# Patient Record
Sex: Male | Born: 1945 | Race: White | Hispanic: No | Marital: Married | Smoking: Former smoker
Health system: Southern US, Community
[De-identification: ages and names within clinical notes are randomized; demographics above are authoritative.]

## PROBLEM LIST (undated history)

## (undated) DIAGNOSIS — E119 Type 2 diabetes mellitus without complications: Secondary | ICD-10-CM

## (undated) DIAGNOSIS — K219 Gastro-esophageal reflux disease without esophagitis: Secondary | ICD-10-CM

## (undated) DIAGNOSIS — T8859XA Other complications of anesthesia, initial encounter: Secondary | ICD-10-CM

## (undated) DIAGNOSIS — I739 Peripheral vascular disease, unspecified: Secondary | ICD-10-CM

## (undated) DIAGNOSIS — F329 Major depressive disorder, single episode, unspecified: Secondary | ICD-10-CM

## (undated) DIAGNOSIS — E781 Pure hyperglyceridemia: Secondary | ICD-10-CM

## (undated) DIAGNOSIS — G4733 Obstructive sleep apnea (adult) (pediatric): Secondary | ICD-10-CM

## (undated) DIAGNOSIS — I10 Essential (primary) hypertension: Secondary | ICD-10-CM

## (undated) DIAGNOSIS — M8580 Other specified disorders of bone density and structure, unspecified site: Secondary | ICD-10-CM

## (undated) DIAGNOSIS — C439 Malignant melanoma of skin, unspecified: Secondary | ICD-10-CM

## (undated) DIAGNOSIS — M549 Dorsalgia, unspecified: Secondary | ICD-10-CM

## (undated) DIAGNOSIS — T4145XA Adverse effect of unspecified anesthetic, initial encounter: Secondary | ICD-10-CM

## (undated) DIAGNOSIS — K279 Peptic ulcer, site unspecified, unspecified as acute or chronic, without hemorrhage or perforation: Secondary | ICD-10-CM

## (undated) DIAGNOSIS — G8929 Other chronic pain: Secondary | ICD-10-CM

## (undated) DIAGNOSIS — F5102 Adjustment insomnia: Secondary | ICD-10-CM

## (undated) DIAGNOSIS — M4800 Spinal stenosis, site unspecified: Secondary | ICD-10-CM

## (undated) DIAGNOSIS — K5792 Diverticulitis of intestine, part unspecified, without perforation or abscess without bleeding: Secondary | ICD-10-CM

## (undated) HISTORY — PX: ELBOW SURGERY: SHX618

## (undated) HISTORY — PX: OTHER SURGICAL HISTORY: SHX169

## (undated) HISTORY — PX: APPENDECTOMY: SHX54

## (undated) HISTORY — PX: SHOULDER SURGERY: SHX246

## (undated) HISTORY — PX: CARPAL TUNNEL RELEASE: SHX101

## (undated) HISTORY — PX: HERNIA REPAIR: SHX51

## (undated) HISTORY — PX: CERVICAL FUSION: SHX112

---

## 2000-03-26 ENCOUNTER — Ambulatory Visit (HOSPITAL_BASED_OUTPATIENT_CLINIC_OR_DEPARTMENT_OTHER): Admission: RE | Admit: 2000-03-26 | Discharge: 2000-03-26 | Payer: Self-pay | Admitting: Orthopedic Surgery

## 2000-06-18 ENCOUNTER — Encounter: Payer: Self-pay | Admitting: Emergency Medicine

## 2000-06-19 ENCOUNTER — Inpatient Hospital Stay (HOSPITAL_COMMUNITY): Admission: EM | Admit: 2000-06-19 | Discharge: 2000-06-20 | Payer: Self-pay | Admitting: Emergency Medicine

## 2002-03-19 ENCOUNTER — Encounter: Admission: RE | Admit: 2002-03-19 | Discharge: 2002-03-19 | Payer: Self-pay | Admitting: *Deleted

## 2003-01-11 ENCOUNTER — Ambulatory Visit (HOSPITAL_COMMUNITY): Admission: RE | Admit: 2003-01-11 | Discharge: 2003-01-11 | Payer: Self-pay | Admitting: *Deleted

## 2003-01-11 ENCOUNTER — Encounter (INDEPENDENT_AMBULATORY_CARE_PROVIDER_SITE_OTHER): Payer: Self-pay | Admitting: Specialist

## 2004-02-15 ENCOUNTER — Encounter: Admission: RE | Admit: 2004-02-15 | Discharge: 2004-05-15 | Payer: Self-pay | Admitting: Psychology

## 2005-05-29 ENCOUNTER — Emergency Department (HOSPITAL_COMMUNITY): Admission: EM | Admit: 2005-05-29 | Discharge: 2005-05-30 | Payer: Self-pay | Admitting: Emergency Medicine

## 2008-02-06 ENCOUNTER — Observation Stay (HOSPITAL_COMMUNITY): Admission: EM | Admit: 2008-02-06 | Discharge: 2008-02-07 | Payer: Self-pay | Admitting: Emergency Medicine

## 2009-08-12 ENCOUNTER — Encounter: Admission: RE | Admit: 2009-08-12 | Discharge: 2009-08-12 | Payer: Self-pay | Admitting: Orthopaedic Surgery

## 2010-06-08 ENCOUNTER — Ambulatory Visit: Payer: Self-pay | Admitting: Diagnostic Radiology

## 2010-06-08 ENCOUNTER — Emergency Department (HOSPITAL_BASED_OUTPATIENT_CLINIC_OR_DEPARTMENT_OTHER): Admission: EM | Admit: 2010-06-08 | Discharge: 2010-06-08 | Payer: Self-pay | Admitting: Emergency Medicine

## 2010-10-08 ENCOUNTER — Encounter: Payer: Self-pay | Admitting: Family Medicine

## 2010-11-30 LAB — BASIC METABOLIC PANEL
Calcium: 9.6 mg/dL (ref 8.4–10.5)
Chloride: 106 mEq/L (ref 96–112)
Creatinine, Ser: 1.2 mg/dL (ref 0.4–1.5)
GFR calc Af Amer: >60 mL/min (ref >60)
Sodium: 142 mEq/L (ref 135–145)

## 2010-11-30 LAB — CBC
Platelets: 216 10*3/uL (ref 150–400)
RBC: 3.96 MIL/uL — ABNORMAL LOW (ref 4.22–5.81)
WBC: 5.8 10*3/uL (ref 4.0–10.5)

## 2010-11-30 LAB — DIFFERENTIAL
Lymphocytes Relative: 29 % (ref 12–46)
Lymphs Abs: 1.7 10*3/uL (ref 0.7–4.0)
Neutrophils Relative %: 58 % (ref 43–77)

## 2011-01-30 NOTE — H&P (Signed)
NAME:  [name], [name] NO.:  [id number]   MEDICAL RECORD NO.:  [id number]          PATIENT TYPE:  INP   LOCATION:  6529                         FACILITY:  [facility]   PHYSICIAN:  Michiel Cowboy, MDDATE OF BIRTH:  04-25-1946   DATE OF ADMISSION:  02/05/2008  DATE OF DISCHARGE:                              HISTORY & PHYSICAL   PRIMARY CARE [place]:  Dr. Corliss Blacker.   CHIEF COMPLAINT:  Chest pain.   The patient is a 65 year old gentleman, history of hypertension and  atypical chest pain status post heart catheterization in 2001 which was  negative, presents with a similar episode of chest pain that started mid  afternoon, was throbbing-like in sensation, was in the left chest, was  on and off throughout the day, kept on coming back, and now worse in  severity than before which prompted the patient to come into the  emergency department.  He also noticed to have abdominal swelling, which  is new for him.  Other than that, no fevers, no chills, no shortness of  breath, maybe a little bit of lightheadedness.  Reports having a bowel  movement this morning.  Otherwise, review of systems is unremarkable.   PAST MEDICAL HISTORY:  Significant for:  1. Hypertension.  2. Chronic back pain.  3. Spinal stenosis.  4. History of irritable bowel syndrome.  5. Peptic ulcer disease.   SOCIAL HISTORY:  The patient lives with wife.  Denies alcohol or drug  abuse.  Quit smoking as a teenager.   FAMILY HISTORY:  Noncontributory.   ALLERGIES:  The patient is allergic to [facility].   MEDICATIONS:  The patient is unsure about his doses.  1. Takes benazepril two tablets once a day.  2. Hydrocodone/acetaminophen three times a day as needed.  3. Metoprolol twice a day.  4. Cymbalta - he thinks his does is 90 mg p.o. daily.  5. AcipHex 40 mg p.o. daily.  6. Baby aspirin.   PHYSICAL EXAMINATION:  VITAL SIGNS:  Temperature 98.8, blood pressure  147/89, pulse 73, respirations 17,  saturating 96% on room air.  The  patient appears to be in no acute distress, lying down on the stretcher.  HEAD:  Nontraumatic, somewhat dry mucous membranes.  LUNGS:  Clear to auscultation bilaterally.  HEART:  Regular rate and rhythm.  No murmurs, rubs or gallops.  LOWER EXTREMITIES:  Without edema.  ABDOMEN:  Distended with diminished bowel sounds.  NEUROLOGIC:  Intact.   LABORATORY:  White blood cell count 6.6, hemoglobin 13.9.  Sodium 141,  potassium 4.1, creatinine 1.2.  Cardiac enzymes negative.  Alk phos 51,  AST 73, ALT 69, total protein 6.9, albumin 3.9.  Myoglobin slightly  elevated at 222.  Chest x-ray showing possible left lower lobe  infiltrate versus atelectasis.  Abdominal film with moderate amount of  stool throughout the colon and large abdominal calcifications,  nonobstructive gas pattern.   EKG showing sinus rhythm; Q waves in lead II, III; small Q waves in V4,  V5, V6; unsure of the significance; no evidence of ischemia or  infarction.   ASSESSMENT AND  PLAN:  1. This is a 65 year old gentleman with atypical chest pain, but given      history of hypertension will admit for assessment of acute coronary      syndrome.  Will obtain cardiac enzymes x3, serial ECG, fasting      lipid panel in a.m., hemoglobin A1c, TSH.  Also will obtain D-      dimer, although the patient is at low risk for PE.  The left lower      extremity atelectasis/infiltrate, the patient is afebrile, has      normal white blood cell count.  Will repeat chest x-ray in a.m.      with better inspiration to see if this resolves.  If it does not,      would need to start on antibiotics.  For now, will hold off.  2. Elevated liver function tests, etiology not clear.  The patient      denies drinking.  Will obtain hepatitis serologies and right upper      quadrant ultrasound to evaluate liver.  3. Abdominal distention.  Right upper quadrant ultrasound will help [place]      to determine if there is any  ascites.  Likely constipation.  Will      give MiraLax, simethicone, Colace.  4. Hypertension.  The patient is unsure of his medications.  Will give      metoprolol 12.5 b.i.d., will need to readjust this in the morning,      and  benazepril 20.  5. Prophylaxis.  Protonix - the patient is already on AcipHex at home,      and Lovenox.  6. Mild dehydration.  Give IV fluids overnight.      Michiel Cowboy, MD  Electronically Signed     AVD/MEDQ  D:  02/06/2008  T:  02/06/2008  Job:  [id number]   cc:   Pam Drown, M.D.

## 2011-02-02 NOTE — Discharge Summary (Signed)
[place]  Patient:    [name], [name]                       MRN: [id number] Adm. Date:  [id number] Disc. Date: [id number] Attending:  Lewayne Bunting Dictator:   Delton See, P.A. CC:         Dr. Quintella Reichert   Discharge Summary  HISTORY OF PRESENT ILLNESS:  [name] is a 65 year old male with a history of hypertension, irritable bowel syndrome, as well as a history of duodenal ulcer, who presented to the emergency room for evaluation of chest pain. The patient related that he had had a stressful day at work and began having chest pain and nausea around 1 p.m. He described the chest pain as a constant throbbing in the left side of his chest, described as a pressure, associated with left hand tingling. The patient was admitted for further evaluation.  PAST MEDICAL HISTORY: 1. Hypertension. 2. History of irritable bowel syndrome. 3. History of duodenal ulcer in 1981.  PAST MEDICAL HISTORY: 1. Status post right hand surgery. 2. Status post appendectomy. 3. Status post bilateral inguinal hernia repair. 4. Status post carpal tunnel release surgery.  SOCIAL HISTORY:  The patient is married and lives with his wife. He has daughters, as well as a son-in-law. He works for a [employer] and has a lot of stress with his job. He has smoked one pack per day for five years. He quit 30 years ago. He drinks minimally. Does not use illegal drugs.  ALLERGIES:  A medication used for spastic colon, he is not sure what the medication was, but he had swelling as a result.  MEDICATIONS AT THE TIME OF ADMISSION: 1. Lotensin 20 mg daily. 2. Aspirin 81 mg daily. 3. Multivitamin daily. 4. Vitamin E daily. 5. Stress tabs p.r.n. 6. Prevacid p.r.n.  HOSPITAL COURSE:  As noted, this patient was admitted to [facility] through the emergency room by Dr. Sharrell Ku for further evaluation of chest pain. He ruled out for an MI.  The patient underwent  cardiac catheterization on June 20, 2000, performed by Dr. Antoine Poche. The patient was found to have normal coronary arteries with an ejection fraction of 65%. Arrangements were made to discharge the patient on June 20, 2000 in an improved condition. His blood pressure was mildly high on the day of discharge. He had been switched to Lopressor initially, after admission, and his Lotensin had been held. On the day of discharge, the Lopressor was discontinued and the Lotensin was resumed. Arrangements were made to discharge the patient later that day in an improved condition.  LABORATORY DATA:  CK-MB on the day of discharge was within normal limits. Basic metabolic panel, on the day of discharge, was within normal limits with a BUN of 15, creatinine 0.9, potassium 4.1. A CBC, on the day of discharge, was normal with hemoglobin 13.9, hematocrit 38.9, WBCs 8.9, platelets 196,000. Cardiac enzymes were negative.  An EKG showed normal sinus rhythm, rate 78 beats per minute with nonspecific T wave abnormalities.  DISCHARGE MEDICATIONS: 1. Lotensin 20 mg daily. 2. Aspirin 81 mg daily. 3. Prevacid as previously taken.  DISCHARGE INSTRUCTIONS:  The patient was told to avoid any strenuous activity or driving for two days. He is to be on a low salt, low fat diet. He was told to call the office if he had any increased pain, swelling or bleeding from his groin. He was to see  Dr. Quintella Reichert in approximately two weeks. He was to followup with Dr. Ladona Ridgel, or his physician assistant, Thursday, October 11, at 8:45 a.m. for a groin check, as well as further evaluation of hypertension.  PROBLEMS AT THE TIME OF DISCHARGE: 1. Chest pain with cardiac catheterization June 20, 2000 showing normal    coronary arteries, ejection fraction greater than 60%. 2. Hypertension. 3. History of duodenal ulcer. 4. History of multiple surgeries. 5. History of irritable bowel syndrome. DD:  06/20/00 TD:  06/20/00 Job:  [id number]

## 2011-03-15 ENCOUNTER — Other Ambulatory Visit (HOSPITAL_COMMUNITY): Payer: Self-pay | Admitting: Anesthesiology

## 2011-03-15 ENCOUNTER — Ambulatory Visit (HOSPITAL_COMMUNITY)
Admission: RE | Admit: 2011-03-15 | Discharge: 2011-03-15 | Disposition: A | Discharge status: Home / Self Care | Payer: Medicare Other | Source: Ambulatory Visit | Attending: Anesthesiology | Admitting: Anesthesiology

## 2011-03-15 DIAGNOSIS — M40299 Other kyphosis, site unspecified: Secondary | ICD-10-CM | POA: Insufficient documentation

## 2011-03-15 DIAGNOSIS — G8929 Other chronic pain: Secondary | ICD-10-CM

## 2011-03-15 DIAGNOSIS — M542 Cervicalgia: Secondary | ICD-10-CM | POA: Insufficient documentation

## 2011-03-15 DIAGNOSIS — Z981 Arthrodesis status: Secondary | ICD-10-CM | POA: Insufficient documentation

## 2011-06-13 LAB — HEPATIC FUNCTION PANEL
Alkaline Phosphatase: 51
Total Protein: 6.9

## 2011-06-13 LAB — COMPREHENSIVE METABOLIC PANEL
Albumin: 3.6
BUN: 15
BUN: 17
Calcium: 9.1
Calcium: 9.4
Creatinine, Ser: 1.13
Creatinine, Ser: 1.24
Glucose, Bld: 120 — ABNORMAL HIGH
Potassium: 4.5
Sodium: 143
Total Protein: 6.3
Total Protein: 6.4

## 2011-06-13 LAB — LIPID PANEL
HDL: 31 — ABNORMAL LOW
LDL Cholesterol: 90
Triglycerides: 355 — ABNORMAL HIGH

## 2011-06-13 LAB — CBC
HCT: 39.9
Hemoglobin: 13.9
Hemoglobin: 13.9
MCHC: 34
MCV: 93.7
RBC: 4.26
RDW: 13.2
WBC: 6.8

## 2011-06-13 LAB — HEMOGLOBIN A1C: Hgb A1c MFr Bld: 7.6 — ABNORMAL HIGH

## 2011-06-13 LAB — POCT CARDIAC MARKERS
Myoglobin, poc: 222
Myoglobin, poc: 226
Operator id: 265201
Troponin i, poc: <0.05

## 2011-06-13 LAB — LIPASE, BLOOD: Lipase: 45

## 2011-06-13 LAB — POCT I-STAT, CHEM 8
BUN: 20
Calcium, Ion: 1.16
Creatinine, Ser: 1.2
Glucose, Bld: 99
TCO2: 28

## 2011-06-13 LAB — CARDIAC PANEL(CRET KIN+CKTOT+MB+TROPI)
CK, MB: 5.7 — ABNORMAL HIGH
CK, MB: 7.8 — ABNORMAL HIGH
Relative Index: 3.2 — ABNORMAL HIGH
Relative Index: 3.5 — ABNORMAL HIGH
Total CK: 180
Troponin I: 0.01

## 2011-06-13 LAB — HEPATITIS B SURFACE ANTIGEN: Hepatitis B Surface Ag: NEGATIVE

## 2011-06-13 LAB — HEPATITIS C ANTIBODY: HCV Ab: NEGATIVE

## 2011-06-13 LAB — D-DIMER, QUANTITATIVE: D-Dimer, Quant: 0.23

## 2011-06-13 LAB — TSH: TSH: 4.709

## 2011-06-13 LAB — B-NATRIURETIC PEPTIDE (CONVERTED LAB): Pro B Natriuretic peptide (BNP): <30

## 2011-06-13 LAB — HEPATITIS B SURFACE ANTIBODY,QUALITATIVE: Hep B S Ab: NEGATIVE

## 2012-08-07 ENCOUNTER — Emergency Department (HOSPITAL_COMMUNITY)
Admission: EM | Admit: 2012-08-07 | Discharge: 2012-08-07 | Disposition: A | Discharge status: Home / Self Care | Payer: Medicare Other | Attending: Emergency Medicine | Admitting: Emergency Medicine

## 2012-08-07 ENCOUNTER — Emergency Department (HOSPITAL_COMMUNITY): Payer: Medicare Other

## 2012-08-07 ENCOUNTER — Encounter (HOSPITAL_COMMUNITY): Payer: Self-pay | Admitting: *Deleted

## 2012-08-07 DIAGNOSIS — Z8659 Personal history of other mental and behavioral disorders: Secondary | ICD-10-CM | POA: Insufficient documentation

## 2012-08-07 DIAGNOSIS — R197 Diarrhea, unspecified: Secondary | ICD-10-CM | POA: Insufficient documentation

## 2012-08-07 DIAGNOSIS — I10 Essential (primary) hypertension: Secondary | ICD-10-CM | POA: Insufficient documentation

## 2012-08-07 DIAGNOSIS — R42 Dizziness and giddiness: Secondary | ICD-10-CM | POA: Insufficient documentation

## 2012-08-07 DIAGNOSIS — Z8711 Personal history of peptic ulcer disease: Secondary | ICD-10-CM | POA: Insufficient documentation

## 2012-08-07 DIAGNOSIS — Z8739 Personal history of other diseases of the musculoskeletal system and connective tissue: Secondary | ICD-10-CM | POA: Insufficient documentation

## 2012-08-07 DIAGNOSIS — R55 Syncope and collapse: Secondary | ICD-10-CM | POA: Insufficient documentation

## 2012-08-07 DIAGNOSIS — Z8669 Personal history of other diseases of the nervous system and sense organs: Secondary | ICD-10-CM | POA: Insufficient documentation

## 2012-08-07 DIAGNOSIS — Z8719 Personal history of other diseases of the digestive system: Secondary | ICD-10-CM | POA: Insufficient documentation

## 2012-08-07 DIAGNOSIS — R112 Nausea with vomiting, unspecified: Secondary | ICD-10-CM | POA: Insufficient documentation

## 2012-08-07 DIAGNOSIS — R079 Chest pain, unspecified: Secondary | ICD-10-CM

## 2012-08-07 DIAGNOSIS — R0789 Other chest pain: Secondary | ICD-10-CM | POA: Insufficient documentation

## 2012-08-07 DIAGNOSIS — Z8589 Personal history of malignant neoplasm of other organs and systems: Secondary | ICD-10-CM | POA: Insufficient documentation

## 2012-08-07 DIAGNOSIS — E785 Hyperlipidemia, unspecified: Secondary | ICD-10-CM | POA: Insufficient documentation

## 2012-08-07 DIAGNOSIS — R109 Unspecified abdominal pain: Secondary | ICD-10-CM

## 2012-08-07 DIAGNOSIS — K5900 Constipation, unspecified: Secondary | ICD-10-CM | POA: Insufficient documentation

## 2012-08-07 DIAGNOSIS — Z8679 Personal history of other diseases of the circulatory system: Secondary | ICD-10-CM | POA: Insufficient documentation

## 2012-08-07 HISTORY — DX: Major depressive disorder, single episode, unspecified: F32.9

## 2012-08-07 HISTORY — DX: Spinal stenosis, site unspecified: M48.00

## 2012-08-07 HISTORY — DX: Gastro-esophageal reflux disease without esophagitis: K21.9

## 2012-08-07 HISTORY — DX: Peptic ulcer, site unspecified, unspecified as acute or chronic, without hemorrhage or perforation: K27.9

## 2012-08-07 HISTORY — DX: Essential (primary) hypertension: I10

## 2012-08-07 HISTORY — DX: Diverticulitis of intestine, part unspecified, without perforation or abscess without bleeding: K57.92

## 2012-08-07 HISTORY — DX: Obstructive sleep apnea (adult) (pediatric): G47.33

## 2012-08-07 HISTORY — DX: Malignant melanoma of skin, unspecified: C43.9

## 2012-08-07 HISTORY — DX: Adjustment insomnia: F51.02

## 2012-08-07 HISTORY — DX: Other specified disorders of bone density and structure, unspecified site: M85.80

## 2012-08-07 HISTORY — DX: Peripheral vascular disease, unspecified: I73.9

## 2012-08-07 HISTORY — DX: Pure hyperglyceridemia: E78.1

## 2012-08-07 LAB — CBC WITH DIFFERENTIAL/PLATELET
Eosinophils Absolute: 0.2 10*3/uL (ref 0.0–0.7)
Eosinophils Relative: 3 % (ref 0–5)
Hemoglobin: 13.9 g/dL (ref 13.0–17.0)
Lymphocytes Relative: 22 % (ref 12–46)
Lymphs Abs: 1.5 10*3/uL (ref 0.7–4.0)
MCH: 31.2 pg (ref 26.0–34.0)
MCV: 90.3 fL (ref 78.0–100.0)
Monocytes Relative: 9 % (ref 3–12)
RBC: 4.45 MIL/uL (ref 4.22–5.81)
WBC: 6.7 10*3/uL (ref 4.0–10.5)

## 2012-08-07 LAB — LIPASE, BLOOD: Lipase: 49 U/L (ref 11–59)

## 2012-08-07 LAB — URINALYSIS, ROUTINE W REFLEX MICROSCOPIC
Bilirubin Urine: NEGATIVE
Hgb urine dipstick: NEGATIVE
Nitrite: NEGATIVE
Protein, ur: NEGATIVE mg/dL
Specific Gravity, Urine: 1.022 (ref 1.005–1.030)
Urobilinogen, UA: 0.2 mg/dL (ref 0.0–1.0)

## 2012-08-07 LAB — COMPREHENSIVE METABOLIC PANEL
ALT: 43 U/L (ref 0–53)
Alkaline Phosphatase: 42 U/L (ref 39–117)
BUN: 20 mg/dL (ref 6–23)
CO2: 27 mEq/L (ref 19–32)
Calcium: 10.3 mg/dL (ref 8.4–10.5)
GFR calc Af Amer: 64 mL/min — ABNORMAL LOW (ref >90)
GFR calc non Af Amer: 55 mL/min — ABNORMAL LOW (ref >90)
Glucose, Bld: 96 mg/dL (ref 70–99)
Potassium: 4.4 mEq/L (ref 3.5–5.1)
Total Protein: 7.1 g/dL (ref 6.0–8.3)

## 2012-08-07 LAB — URINE MICROSCOPIC-ADD ON

## 2012-08-07 LAB — OCCULT BLOOD, POC DEVICE: Fecal Occult Bld: NEGATIVE

## 2012-08-07 MED ORDER — SODIUM CHLORIDE 0.9 % IV BOLUS (SEPSIS): 500.0000 mL | Freq: Once | INTRAVENOUS | Status: DC

## 2012-08-07 MED ORDER — MECLIZINE HCL 50 MG PO TABS: 25.0000 mg | ORAL_TABLET | Freq: Three times a day (TID) | ORAL | Status: DC | PRN

## 2012-08-07 MED ORDER — ONDANSETRON HCL 4 MG/2ML IJ SOLN
4.0000 mg | Freq: Once | INTRAMUSCULAR | Status: AC
  Administered 2012-08-07: 4 mg via INTRAVENOUS
  Filled 2012-08-07: qty 2

## 2012-08-07 MED ORDER — MECLIZINE HCL 25 MG PO TABS
50.0000 mg | ORAL_TABLET | Freq: Once | ORAL | Status: AC
  Administered 2012-08-07: 50 mg via ORAL
  Filled 2012-08-07: qty 2

## 2012-08-07 MED ORDER — IOHEXOL 300 MG/ML  SOLN
100.0000 mL | Freq: Once | INTRAMUSCULAR | Status: AC | PRN
  Administered 2012-08-07: 100 mL via INTRAVENOUS

## 2012-08-07 MED ORDER — SODIUM CHLORIDE 0.9 % IV SOLN
Freq: Once | INTRAVENOUS | Status: AC
  Administered 2012-08-07: 12:00:00 via INTRAVENOUS

## 2012-08-07 NOTE — ED Provider Notes (Signed)
History     CSN: [id number]  Arrival date & time 08/07/12  1017   First MD Initiated Contact with Patient 08/07/12 1054      Chief Complaint  Patient presents with  . Abdominal Pain  . Bloated  . Nausea  . Emesis    (Consider location/radiation/quality/duration/timing/severity/associated sxs/prior treatment) Patient is a 66 y.o. male presenting with abdominal pain and chest pain. The history is provided by the patient and the spouse.  Abdominal Pain The primary symptoms of the illness include abdominal pain, nausea, vomiting and diarrhea. The primary symptoms of the illness do not include fever, shortness of breath, hematemesis or hematochezia.  Associated symptoms comments: Abdominal swelling and discomfort for 4-5 days. Today he started having nausea and vomiting. No fever, hematemesis, or bloody bowel movements. The only abdominal surgery in the past was inguinal hernia repair. Normal colonoscopies in the past.  .   Chest Pain The chest pain began 2 days ago. Chest pain occurs intermittently. Primary symptoms include abdominal pain, nausea, vomiting and dizziness. Pertinent negatives for primary symptoms include no fever and no shortness of breath.  Dizziness also occurs with nausea and vomiting. Dizziness does not occur with weakness.   Pertinent negatives for associated symptoms include no weakness. Associated symptoms comments: He has been having a very mild central lower chest discomfort that is not associated with SOB, cough, fever. No heart disease in the past. He reports a catheterization many years ago that did not require stenting. He also reports dizziness that started several days ago that is worse in certain positions and with movement. No falls. He has had vertigo in the past and states current symptoms are similar. .      Past Medical History  Diagnosis Date  . Hypertension   . Hypertriglyceridemia   . GERD (gastroesophageal reflux disease)   . Major  depression   . Spinal stenosis   . Osteopenia   . Diverticulitis   . Insomnia, transient   . Melanoma     R Arm, s/p excision 11/2009  . Sleep apnea, obstructive   . PVD (peripheral vascular disease)   . Peptic ulcer disease     Past Surgical History  Procedure Date  . Carpal tunnel release   . Appendectomy   . Cervical fusion   . Lumbar spinal stenosis correction   . Hernia repair     No family history on file.  History  Substance Use Topics  . Smoking status: Not on file  . Smokeless tobacco: Not on file  . Alcohol Use: No      Review of Systems  Constitutional: Negative for fever.  Respiratory: Negative for shortness of breath.   Cardiovascular: Positive for chest pain.  Gastrointestinal: Positive for nausea, vomiting, abdominal pain and diarrhea. Negative for blood in stool, hematochezia and hematemesis.  Musculoskeletal:       The patient has chronic back pain that is no different with current symptoms.  Skin: Negative for rash.  Neurological: Positive for dizziness and syncope. Negative for weakness.  Psychiatric/Behavioral: Negative for confusion.    Allergies  Feldene  Home Medications  No current outpatient prescriptions on file.  BP 132/95  Pulse 61  Temp 97.9 F (36.6 C) (Oral)  Resp 16  Wt 204 lb (92.534 kg)  SpO2 100%  Physical Exam  Constitutional: He is oriented to person, place, and time. He appears well-developed and well-nourished. No distress.  HENT:  Head: Normocephalic.  Neck: Normal range of motion. Neck supple.  Cardiovascular: Normal rate and regular rhythm.   Pulmonary/Chest: Effort normal and breath sounds normal.  Abdominal: Bowel sounds are normal. He exhibits distension. There is tenderness. There is no rebound and no guarding.       The abdomen is distended and tense. BS active.   Musculoskeletal: Normal range of motion.  Neurological: He is alert and oriented to person, place, and time. He displays normal reflexes. He  exhibits normal muscle tone. Coordination normal.  Skin: Skin is warm and dry. No rash noted.  Psychiatric: He has a normal mood and affect.    ED Course  Procedures (including critical care time)   Labs Reviewed  CBC WITH DIFFERENTIAL  COMPREHENSIVE METABOLIC PANEL  LIPASE, BLOOD  URINALYSIS, ROUTINE W REFLEX MICROSCOPIC  TROPONIN I   Results for orders placed during the hospital encounter of 08/07/12  CBC WITH DIFFERENTIAL      Component Value Range   WBC 6.7  4.0 - 10.5 K/uL   RBC 4.45  4.22 - 5.81 MIL/uL   Hemoglobin 13.9  13.0 - 17.0 g/dL   HCT 16.1  09.6 - 04.5 %   MCV 90.3  78.0 - 100.0 fL   MCH 31.2  26.0 - 34.0 pg   MCHC 34.6  30.0 - 36.0 g/dL   RDW 40.9  81.1 - 91.4 %   Platelets 243  150 - 400 K/uL   Neutrophils Relative 66  43 - 77 %   Neutro Abs 4.4  1.7 - 7.7 K/uL   Lymphocytes Relative 22  12 - 46 %   Lymphs Abs 1.5  0.7 - 4.0 K/uL   Monocytes Relative 9  3 - 12 %   Monocytes Absolute 0.6  0.1 - 1.0 K/uL   Eosinophils Relative 3  0 - 5 %   Eosinophils Absolute 0.2  0.0 - 0.7 K/uL   Basophils Relative 0  0 - 1 %   Basophils Absolute 0.0  0.0 - 0.1 K/uL  COMPREHENSIVE METABOLIC PANEL      Component Value Range   Sodium 139  135 - 145 mEq/L   Potassium 4.4  3.5 - 5.1 mEq/L   Chloride 100  96 - 112 mEq/L   CO2 27  19 - 32 mEq/L   Glucose, Bld 96  70 - 99 mg/dL   BUN 20  6 - 23 mg/dL   Creatinine, Ser 7.82  0.50 - 1.35 mg/dL   Calcium 95.6  8.4 - 21.3 mg/dL   Total Protein 7.1  6.0 - 8.3 g/dL   Albumin 4.2  3.5 - 5.2 g/dL   AST 50 (*) 0 - 37 U/L   ALT 43  0 - 53 U/L   Alkaline Phosphatase 42  39 - 117 U/L   Total Bilirubin 0.4  0.3 - 1.2 mg/dL   GFR calc non Af Amer 55 (*) >90 mL/min   GFR calc Af Amer 64 (*) >90 mL/min  LIPASE, BLOOD      Component Value Range   Lipase 49  11 - 59 U/L  TROPONIN I      Component Value Range   Troponin I <0.30  <0.30 ng/mL   Dg Abd Acute W/chest  08/07/2012  *RADIOLOGY REPORT*  Clinical Data: Abdominal pain  and nausea  ACUTE ABDOMEN SERIES (ABDOMEN 2 VIEW & CHEST 1 VIEW)  Comparison: Chest x-ray 02/06/2008.  KUB 08/07/2012  Findings: Cervical and upper thoracic anterior and posterior hardware fusion.  Heart size is normal.  Lungs are clear.  Stool is present throughout the colon without bowel obstruction. No free air or dilated bowel loops.  Total laminectomy L3, L4, L5.  No acute bony change.  IMPRESSION: No active cardiopulmonary disease.  Stool throughout the colon without bowel obstruction.   Original Report Authenticated By: Janeece Riggers, M.D.    Date: 08/07/2012  Rate: 63  Rhythm: normal sinus rhythm  QRS Axis: normal  Intervals: normal  ST/T Wave abnormalities: nonspecific T wave changes  Conduction Disutrbances:nonspecific intraventricular conduction delay  Narrative Interpretation:   Old EKG Reviewed: unchanged   No results found.   No diagnosis found.  1. Abdominal pain 2. Constipation 3. Vertigo 4. Nonspecific chest pain   MDM  Dizziness has improved significantly after Meclizine. He is able to walk to bathroom with symptoms.  No further chest pain. Troponin and EKG non-acute. Do not feel chest pain represents ACS.  CT scan abdomen is negative. No obstruction, blood studies normal. Plain film showing constipation so will treat with laxative and encourage follow up with PCP tomorrow.        Rodena Medin, PA-C 08/07/12 1706  Rodena Medin, PA-C 08/07/12 1718

## 2012-08-07 NOTE — ED Notes (Signed)
Pt c/o of dizziness. Pt states that his bowel movements have been normal other than the diarrhea occurences.

## 2012-08-07 NOTE — ED Notes (Signed)
Patient transported to X-ray 

## 2012-08-07 NOTE — ED Notes (Signed)
Pt sent from [facility] PCP for eval of abd pain, n/v/d, dizziness x3-4. Began n/v today. "a little" chest pain for last few days. "That is not really bothering me, the dizziness and bloating is the worst." Lab work performed today in office shows WBC 7.7, normal Hgb, normal UA.

## 2012-08-08 NOTE — ED Provider Notes (Signed)
Medical screening examination/treatment/procedure(s) were performed by non-physician practitioner and as supervising physician I was immediately available for consultation/collaboration.   Gavin Pound. Oletta Lamas, MD 08/08/12 2126

## 2014-01-27 ENCOUNTER — Other Ambulatory Visit: Payer: Self-pay | Admitting: Orthopaedic Surgery

## 2014-01-27 DIAGNOSIS — M542 Cervicalgia: Secondary | ICD-10-CM

## 2014-01-27 DIAGNOSIS — M549 Dorsalgia, unspecified: Secondary | ICD-10-CM

## 2014-02-03 ENCOUNTER — Ambulatory Visit
Admission: RE | Admit: 2014-02-03 | Discharge: 2014-02-03 | Disposition: A | Discharge status: Home / Self Care | Payer: Medicare Other | Source: Ambulatory Visit | Attending: Orthopaedic Surgery | Admitting: Orthopaedic Surgery

## 2014-02-03 VITALS — BP 158/80 | HR 63

## 2014-02-03 DIAGNOSIS — M542 Cervicalgia: Secondary | ICD-10-CM

## 2014-02-03 DIAGNOSIS — M549 Dorsalgia, unspecified: Secondary | ICD-10-CM

## 2014-02-03 MED ORDER — DIAZEPAM 5 MG PO TABS
5.0000 mg | ORAL_TABLET | Freq: Once | ORAL | Status: AC
  Administered 2014-02-03: 5 mg via ORAL

## 2014-02-03 MED ORDER — IOHEXOL 300 MG/ML  SOLN
10.0000 mL | Freq: Once | INTRAMUSCULAR | Status: AC | PRN
  Administered 2014-02-03: 10 mL via INTRATHECAL

## 2014-02-03 NOTE — Discharge Instructions (Signed)
Myelogram Discharge Instructions  1. Go home and rest quietly for the next 24 hours.  It is important to lie flat for the next 24 hours.  Get up only to go to the restroom.  You may lie in the bed or on a couch on your back, your stomach, your left side or your right side.  You may have one pillow under your head.  You may have pillows between your knees while you are on your side or under your knees while you are on your back.  2. DO NOT drive today.  Recline the seat as far back as it will go, while still wearing your seat belt, on the way home.  3. You may get up to go to the bathroom as needed.  You may sit up for 10 minutes to eat.  You may resume your normal diet and medications unless otherwise indicated.  Drink lots of extra fluids today and tomorrow.  4. The incidence of headache, nausea, or vomiting is about 5% (one in 20 patients).  If you develop a headache, lie flat and drink plenty of fluids until the headache goes away.  Caffeinated beverages may be helpful.  If you develop severe nausea and vomiting or a headache that does not go away with flat bed rest, call [phone].  5. You may resume normal activities after your 24 hours of bed rest is over; however, do not exert yourself strongly or do any heavy lifting tomorrow. If when you get up you have a headache when standing, go back to bed and force fluids for another 24 hours.  6. Call your physician for a follow-up appointment.  The results of your myelogram will be sent directly to your physician by the following day.  7. If you have any questions or if complications develop after you arrive home, please call [phone].  Discharge instructions have been explained to the patient.  The patient, or the person responsible for the patient, fully understands these instructions.      May resume Cymbalta on Feb 04, 2014, after 9:30 am.

## 2014-02-03 NOTE — Progress Notes (Signed)
Patient states he has been off Cymbalta for at least the past two days.  jkl 

## 2014-04-30 ENCOUNTER — Other Ambulatory Visit: Payer: Self-pay | Admitting: Orthopaedic Surgery

## 2014-04-30 DIAGNOSIS — M47814 Spondylosis without myelopathy or radiculopathy, thoracic region: Secondary | ICD-10-CM

## 2014-04-30 DIAGNOSIS — M47812 Spondylosis without myelopathy or radiculopathy, cervical region: Secondary | ICD-10-CM

## 2014-05-10 ENCOUNTER — Ambulatory Visit
Admission: RE | Admit: 2014-05-10 | Discharge: 2014-05-10 | Disposition: A | Discharge status: Home / Self Care | Payer: Medicare Other | Source: Ambulatory Visit | Attending: Orthopaedic Surgery | Admitting: Orthopaedic Surgery

## 2014-05-10 DIAGNOSIS — M47812 Spondylosis without myelopathy or radiculopathy, cervical region: Secondary | ICD-10-CM

## 2014-05-10 DIAGNOSIS — M47814 Spondylosis without myelopathy or radiculopathy, thoracic region: Secondary | ICD-10-CM

## 2015-08-03 ENCOUNTER — Other Ambulatory Visit: Payer: Self-pay | Admitting: Orthopaedic Surgery

## 2015-08-03 DIAGNOSIS — M542 Cervicalgia: Secondary | ICD-10-CM

## 2015-08-18 ENCOUNTER — Ambulatory Visit
Admission: RE | Admit: 2015-08-18 | Discharge: 2015-08-18 | Disposition: A | Discharge status: Home / Self Care | Payer: Medicare Other | Source: Ambulatory Visit | Attending: Orthopaedic Surgery | Admitting: Orthopaedic Surgery

## 2015-08-18 DIAGNOSIS — M542 Cervicalgia: Secondary | ICD-10-CM

## 2015-08-18 MED ORDER — ONDANSETRON HCL 4 MG/2ML IJ SOLN
4.0000 mg | Freq: Once | INTRAMUSCULAR | Status: AC
  Administered 2015-08-18: 4 mg via INTRAMUSCULAR

## 2015-08-18 MED ORDER — IOHEXOL 300 MG/ML  SOLN
10.0000 mL | Freq: Once | INTRAMUSCULAR | Status: AC | PRN
Start: 2015-08-18 — End: 2015-08-18
  Administered 2015-08-18: 10 mL via INTRATHECAL

## 2015-08-18 MED ORDER — MEPERIDINE HCL 100 MG/ML IJ SOLN
50.0000 mg | Freq: Once | INTRAMUSCULAR | Status: AC
  Administered 2015-08-18: 50 mg via INTRAMUSCULAR

## 2015-08-18 NOTE — Progress Notes (Signed)
Pt states he has been off Cymbalta for the past 2 days.  Discharge instructions explained to pt. 

## 2015-08-18 NOTE — Discharge Instructions (Signed)
Myelogram Discharge Instructions  1. Go home and rest quietly for the next 24 hours.  It is important to lie flat for the next 24 hours.  Get up only to go to the restroom.  You may lie in the bed or on a couch on your back, your stomach, your left side or your right side.  You may have one pillow under your head.  You may have pillows between your knees while you are on your side or under your knees while you are on your back.  2. DO NOT drive today.  Recline the seat as far back as it will go, while still wearing your seat belt, on the way home.  3. You may get up to go to the bathroom as needed.  You may sit up for 10 minutes to eat.  You may resume your normal diet and medications unless otherwise indicated.  Drink lots of extra fluids today and tomorrow.  4. The incidence of headache, nausea, or vomiting is about 5% (one in 20 patients).  If you develop a headache, lie flat and drink plenty of fluids until the headache goes away.  Caffeinated beverages may be helpful.  If you develop severe nausea and vomiting or a headache that does not go away with flat bed rest, call [phone].  5. You may resume normal activities after your 24 hours of bed rest is over; however, do not exert yourself strongly or do any heavy lifting tomorrow. If when you get up you have a headache when standing, go back to bed and force fluids for another 24 hours.  6. Call your physician for a follow-up appointment.  The results of your myelogram will be sent directly to your physician by the following day.  7. If you have any questions or if complications develop after you arrive home, please call [phone].  Discharge instructions have been explained to the patient.  The patient, or the person responsible for the patient, fully understands these instructions.       May resume Cymbalta on Dec. 2, 2016, after 9:30 am.

## 2015-09-16 ENCOUNTER — Other Ambulatory Visit: Payer: Self-pay

## 2017-10-27 ENCOUNTER — Emergency Department (HOSPITAL_BASED_OUTPATIENT_CLINIC_OR_DEPARTMENT_OTHER): Payer: Medicare Other

## 2017-10-27 ENCOUNTER — Emergency Department (HOSPITAL_BASED_OUTPATIENT_CLINIC_OR_DEPARTMENT_OTHER)
Admission: EM | Admit: 2017-10-27 | Discharge: 2017-10-27 | Disposition: A | Discharge status: Home / Self Care | Payer: Medicare Other | Attending: Emergency Medicine | Admitting: Emergency Medicine

## 2017-10-27 ENCOUNTER — Encounter (HOSPITAL_BASED_OUTPATIENT_CLINIC_OR_DEPARTMENT_OTHER): Payer: Self-pay | Admitting: *Deleted

## 2017-10-27 ENCOUNTER — Other Ambulatory Visit: Payer: Self-pay

## 2017-10-27 DIAGNOSIS — Z87891 Personal history of nicotine dependence: Secondary | ICD-10-CM | POA: Insufficient documentation

## 2017-10-27 DIAGNOSIS — Z7984 Long term (current) use of oral hypoglycemic drugs: Secondary | ICD-10-CM | POA: Diagnosis not present

## 2017-10-27 DIAGNOSIS — E119 Type 2 diabetes mellitus without complications: Secondary | ICD-10-CM | POA: Insufficient documentation

## 2017-10-27 DIAGNOSIS — S3992XA Unspecified injury of lower back, initial encounter: Secondary | ICD-10-CM | POA: Diagnosis present

## 2017-10-27 DIAGNOSIS — Z85820 Personal history of malignant melanoma of skin: Secondary | ICD-10-CM | POA: Insufficient documentation

## 2017-10-27 DIAGNOSIS — Y939 Activity, unspecified: Secondary | ICD-10-CM | POA: Diagnosis not present

## 2017-10-27 DIAGNOSIS — I10 Essential (primary) hypertension: Secondary | ICD-10-CM | POA: Diagnosis not present

## 2017-10-27 DIAGNOSIS — Z7982 Long term (current) use of aspirin: Secondary | ICD-10-CM | POA: Diagnosis not present

## 2017-10-27 DIAGNOSIS — W01198A Fall on same level from slipping, tripping and stumbling with subsequent striking against other object, initial encounter: Secondary | ICD-10-CM | POA: Insufficient documentation

## 2017-10-27 DIAGNOSIS — Z79899 Other long term (current) drug therapy: Secondary | ICD-10-CM | POA: Insufficient documentation

## 2017-10-27 DIAGNOSIS — R0782 Intercostal pain: Secondary | ICD-10-CM | POA: Insufficient documentation

## 2017-10-27 DIAGNOSIS — Y999 Unspecified external cause status: Secondary | ICD-10-CM | POA: Insufficient documentation

## 2017-10-27 DIAGNOSIS — R071 Chest pain on breathing: Secondary | ICD-10-CM | POA: Diagnosis not present

## 2017-10-27 DIAGNOSIS — W19XXXA Unspecified fall, initial encounter: Secondary | ICD-10-CM

## 2017-10-27 DIAGNOSIS — Y92002 Bathroom of unspecified non-institutional (private) residence single-family (private) house as the place of occurrence of the external cause: Secondary | ICD-10-CM | POA: Insufficient documentation

## 2017-10-27 DIAGNOSIS — S39012A Strain of muscle, fascia and tendon of lower back, initial encounter: Secondary | ICD-10-CM | POA: Insufficient documentation

## 2017-10-27 HISTORY — DX: Type 2 diabetes mellitus without complications: E11.9

## 2017-10-27 MED ORDER — HYDROMORPHONE HCL 1 MG/ML IJ SOLN
1.0000 mg | Freq: Once | INTRAMUSCULAR | Status: AC
  Administered 2017-10-27: 1 mg via INTRAMUSCULAR
  Filled 2017-10-27: qty 1

## 2017-10-27 NOTE — ED Notes (Signed)
Patient c/o R/L ribs and has difficulty breathing.

## 2017-10-27 NOTE — ED Triage Notes (Signed)
Pt reports he fell yesterday in bathroom and hit his back on the edge of the step going into the tub. C/o pain in back and right ribs. Pt ambulatory to triage without need for assist

## 2017-10-27 NOTE — ED Provider Notes (Signed)
MSE was initiated and I personally evaluated the patient and placed orders (if any) at  5:00 PM on October 27, 2017.  I was approached by radiology to screen his patient prior to x-rays.  Briefly, patient slipped on his bathroom floor last night and hit his back on the tub ledge.  He did not hit his head or lose consciousness.  He has been ambulatory since, however has had pain with breathing and movement.  Patient has history of cervical fracture with significant hardware in his cervical and thoracic spine.  He denies any new neck pain out of his baseline.  He reports thoracic and lumbar pain.  He denies any numbness or tingling to his legs.  He reports right after the fall, he had numbness and tingling as well as a blue tint to his bilateral third fingers, however this resolved soon after.  He denies any saddle anesthesia or bowel or bladder incontinence.  Lungs are clear to auscultation.  Symmetrical movement.  Edema over T12-L1  With associated significant tenderness over that area and above and below.  No cervical tenderness.  Normal sensation to all 4 extremities.  Equal bilateral grip strength.  5/5 strength to all 4 extremities.  Chest x-ray with bilateral ribs, thoracic, and lumbar spine x-rays ordered at this time.  The patient appears stable so that the remainder of the MSE may be completed by another provider.   Frederica Kuster, PA-C 10/27/17 1704    Quintella Reichert, MD 10/28/17 [phone]

## 2017-11-12 NOTE — ED Provider Notes (Signed)
[facility] EMERGENCY DEPARTMENT Provider Note   CSN: [id number] Arrival date & time: 10/27/17  1421     History   Chief Complaint Chief Complaint  Patient presents with  . Fall    HPI [name] is a 72 y.o. male.  The history is provided by the patient. No language interpreter was used.  Fall     [name] is a 72 y.o. male who presents to the Emergency Department complaining of fall.  Last night he was in the bathroom and had a slip and struck his back on the edge of the tub.  He reports pain in his low back.  There was no loss of consciousness.  No neck pain, headache.  He does have some pain over his right rib margin with deep breaths.  No shortness of breath.  He does have pain in his back with ambulation.  Past Medical History:  Diagnosis Date  . Diabetes mellitus without complication ([place])   . Diverticulitis   . GERD (gastroesophageal reflux disease)   . Hypertension   . Hypertriglyceridemia   . Insomnia, transient   . Major depression   . Melanoma ([place])    R Arm, s/p excision 11/2009  . Osteopenia   . Peptic ulcer disease   . PVD (peripheral vascular disease) ([place])   . Sleep apnea, obstructive   . Spinal stenosis     There are no active problems to display for this patient.   Past Surgical History:  Procedure Laterality Date  . APPENDECTOMY    . CARPAL TUNNEL RELEASE    . CERVICAL FUSION    . ELBOW SURGERY    . HERNIA REPAIR    . lumbar spinal stenosis correction    . SHOULDER SURGERY         Home Medications    Prior to Admission medications   Medication Sig Start Date End Date Taking? Authorizing Provider  aspirin EC 81 MG tablet Take 81 mg by mouth daily.    [provider]  benazepril-hydrochlorthiazide (LOTENSIN HCT) 20-12.5 MG per tablet Take 1 tablet by mouth daily.    [provider]  Cholecalciferol (VITAMIN D3) 2000 UNITS TABS Take 2 capsules by mouth 2 (two) times daily.    [provider]  DULoxetine (CYMBALTA) 60 MG capsule Take 60 mg by mouth daily.    [provider]  fenofibrate micronized (LOFIBRA) 200 MG capsule Take 200 mg by mouth daily before breakfast.    [provider]  meclizine (ANTIVERT) 50 MG tablet Take 0.5 tablets (25 mg total) by mouth 3 (three) times daily as needed. 08/07/12   Charlann Lange, PA-C  metFORMIN (GLUCOPHAGE) 500 MG tablet Take 500 mg by mouth at bedtime.    [provider]  metoprolol tartrate (LOPRESSOR) 25 MG tablet Take 25 mg by mouth 2 (two) times daily.    [provider]  Multiple Vitamin (MULTIVITAMIN WITH MINERALS) TABS Take 1 tablet by mouth daily.    [provider]  omega-3 acid ethyl esters (LOVAZA) 1 G capsule Take 2 g by mouth 2 (two) times daily.    [provider]  oxyCODONE-acetaminophen (PERCOCET) 7.5-500 MG per tablet Take 1 tablet by mouth every 4 (four) hours as needed. Pain    [provider]  pantoprazole (PROTONIX) 40 MG tablet Take 40 mg by mouth daily.    [provider]  simvastatin (ZOCOR) 20 MG tablet Take 20 mg by mouth every evening.  [provider]  zolpidem (AMBIEN) 10 MG tablet Take 10 mg by mouth at bedtime as needed.    [provider]    Family History Family History  Problem Relation Age of Onset  . Throat cancer Father     Social History Social History   Tobacco Use  . Smoking status: Former Research scientist (life sciences)  . Smokeless tobacco: Never Used  Substance Use Topics  . Alcohol use: No  . Drug use: No     Allergies   Feldene [piroxicam]   Review of Systems Review of Systems  All other systems reviewed and are negative.    Physical Exam Updated Vital Signs BP 126/71 (BP Location: Right Arm)   Pulse 76   Temp 98.2 F (36.8 C) (Oral)   Resp 16   Ht 5' 8.5" (1.74 m)   Wt 82.6 kg (182 lb)   SpO2 98%   BMI 27.27 kg/m   Physical Exam  Constitutional: He is oriented to person, place, and time. He  appears well-developed and well-nourished.  HENT:  Head: Normocephalic and atraumatic.  Cardiovascular: Normal rate and regular rhythm.  No murmur heard. Pulmonary/Chest: Effort normal and breath sounds normal. No respiratory distress.  Abdominal: Soft. There is no tenderness. There is no rebound and no guarding.  Musculoskeletal: He exhibits no edema.  Tenderness to palpation over the lower and mid lumbar region in the paraspinous muscles, left greater than right.  Neurological: He is alert and oriented to person, place, and time.  5 out of 5 strength in all 4 extremities with sensation to light touch intact in all 4 extremities.  Skin: Skin is warm and dry.  Psychiatric: He has a normal mood and affect. His behavior is normal.  Nursing note and vitals reviewed.    ED Treatments / Results  Labs (all labs ordered are listed, but only abnormal results are displayed) Labs Reviewed - No data to display  EKG  EKG Interpretation None       Radiology No results found.  Procedures Procedures (including critical care time)  Medications Ordered in ED Medications  HYDROmorphone (DILAUDID) injection 1 mg (1 mg Intramuscular Given 10/27/17 2057)     Initial Impression / Assessment and Plan / ED Course  I have reviewed the triage vital signs and the nursing notes.  Pertinent labs & imaging results that were available during my care of the patient were reviewed by me and considered in my medical decision making (see chart for details).     Patient here for evaluation of injuries following a mechanical fall.  He does have significant tenderness over his lower back.  He has normal strength and sensation in all 4 extremities.  No evidence of acute cord compression.  Imaging is negative for acute fracture.  Discussed with patient home care for lumbar contusion/strain.  Discussed importance of outpatient follow-up and return precautions.  Final Clinical Impressions(s) / ED Diagnoses    Final diagnoses:  Fall  Strain of lumbar region, initial encounter    ED Discharge Orders    None       Quintella Reichert, MD 11/12/17 [phone]

## 2017-12-09 ENCOUNTER — Other Ambulatory Visit: Payer: Self-pay | Admitting: Orthopedic Surgery

## 2017-12-09 DIAGNOSIS — R229 Localized swelling, mass and lump, unspecified: Principal | ICD-10-CM

## 2017-12-09 DIAGNOSIS — IMO0002 Reserved for concepts with insufficient information to code with codable children: Secondary | ICD-10-CM

## 2017-12-13 ENCOUNTER — Ambulatory Visit
Admission: RE | Admit: 2017-12-13 | Discharge: 2017-12-13 | Disposition: A | Discharge status: Home / Self Care | Payer: Medicare Other | Source: Ambulatory Visit | Attending: Orthopedic Surgery | Admitting: Orthopedic Surgery

## 2017-12-13 ENCOUNTER — Other Ambulatory Visit: Payer: Medicare Other

## 2017-12-13 DIAGNOSIS — R229 Localized swelling, mass and lump, unspecified: Principal | ICD-10-CM

## 2017-12-13 DIAGNOSIS — IMO0002 Reserved for concepts with insufficient information to code with codable children: Secondary | ICD-10-CM

## 2017-12-19 ENCOUNTER — Other Ambulatory Visit: Payer: Self-pay | Admitting: Orthopedic Surgery

## 2017-12-19 ENCOUNTER — Encounter (HOSPITAL_BASED_OUTPATIENT_CLINIC_OR_DEPARTMENT_OTHER): Payer: Self-pay | Admitting: *Deleted

## 2017-12-19 ENCOUNTER — Other Ambulatory Visit: Payer: Self-pay

## 2017-12-20 ENCOUNTER — Encounter (HOSPITAL_BASED_OUTPATIENT_CLINIC_OR_DEPARTMENT_OTHER): Payer: Self-pay | Admitting: Certified Registered"

## 2017-12-20 ENCOUNTER — Ambulatory Visit (HOSPITAL_BASED_OUTPATIENT_CLINIC_OR_DEPARTMENT_OTHER)
Admission: RE | Admit: 2017-12-20 | Discharge: 2017-12-20 | Disposition: A | Discharge status: Home / Self Care | Payer: Medicare Other | Source: Ambulatory Visit | Attending: Orthopedic Surgery | Admitting: Orthopedic Surgery

## 2017-12-20 ENCOUNTER — Ambulatory Visit (HOSPITAL_BASED_OUTPATIENT_CLINIC_OR_DEPARTMENT_OTHER): Payer: Medicare Other | Admitting: Anesthesiology

## 2017-12-20 ENCOUNTER — Encounter (HOSPITAL_BASED_OUTPATIENT_CLINIC_OR_DEPARTMENT_OTHER)
Admission: RE | Disposition: A | Discharge status: Home / Self Care | Payer: Self-pay | Source: Ambulatory Visit | Attending: Orthopedic Surgery

## 2017-12-20 DIAGNOSIS — E119 Type 2 diabetes mellitus without complications: Secondary | ICD-10-CM | POA: Diagnosis not present

## 2017-12-20 DIAGNOSIS — M779 Enthesopathy, unspecified: Secondary | ICD-10-CM | POA: Diagnosis present

## 2017-12-20 DIAGNOSIS — Y929 Unspecified place or not applicable: Secondary | ICD-10-CM | POA: Diagnosis not present

## 2017-12-20 DIAGNOSIS — I10 Essential (primary) hypertension: Secondary | ICD-10-CM | POA: Insufficient documentation

## 2017-12-20 DIAGNOSIS — K219 Gastro-esophageal reflux disease without esophagitis: Secondary | ICD-10-CM | POA: Diagnosis not present

## 2017-12-20 DIAGNOSIS — S66811A Strain of other specified muscles, fascia and tendons at wrist and hand level, right hand, initial encounter: Secondary | ICD-10-CM | POA: Diagnosis not present

## 2017-12-20 DIAGNOSIS — M1811 Unilateral primary osteoarthritis of first carpometacarpal joint, right hand: Secondary | ICD-10-CM | POA: Diagnosis not present

## 2017-12-20 DIAGNOSIS — G4730 Sleep apnea, unspecified: Secondary | ICD-10-CM | POA: Insufficient documentation

## 2017-12-20 DIAGNOSIS — Z7984 Long term (current) use of oral hypoglycemic drugs: Secondary | ICD-10-CM | POA: Insufficient documentation

## 2017-12-20 DIAGNOSIS — M65841 Other synovitis and tenosynovitis, right hand: Secondary | ICD-10-CM | POA: Insufficient documentation

## 2017-12-20 DIAGNOSIS — Z87891 Personal history of nicotine dependence: Secondary | ICD-10-CM | POA: Diagnosis not present

## 2017-12-20 DIAGNOSIS — Z79899 Other long term (current) drug therapy: Secondary | ICD-10-CM | POA: Diagnosis not present

## 2017-12-20 DIAGNOSIS — X58XXXA Exposure to other specified factors, initial encounter: Secondary | ICD-10-CM | POA: Insufficient documentation

## 2017-12-20 HISTORY — PX: TENOLYSIS: SHX396

## 2017-12-20 LAB — POCT I-STAT, CHEM 8
BUN: 23 mg/dL — AB (ref 6–20)
CALCIUM ION: 1.22 mmol/L (ref 1.15–1.40)
CREATININE: 1.3 mg/dL — AB (ref 0.61–1.24)
Chloride: 102 mmol/L (ref 101–111)
GLUCOSE: 108 mg/dL — AB (ref 65–99)
HCT: 40 % (ref 39.0–52.0)
Hemoglobin: 13.6 g/dL (ref 13.0–17.0)
Potassium: 4 mmol/L (ref 3.5–5.1)
SODIUM: 143 mmol/L (ref 135–145)
TCO2: 27 mmol/L (ref 22–32)

## 2017-12-20 LAB — GLUCOSE, CAPILLARY
Glucose-Capillary: 91 mg/dL (ref 65–99)
Glucose-Capillary: 94 mg/dL (ref 65–99)

## 2017-12-20 SURGERY — INCISION, TENDON SHEATH
Anesthesia: General | Site: Wrist | Laterality: Right

## 2017-12-20 MED ORDER — FENTANYL CITRATE (PF) 100 MCG/2ML IJ SOLN
INTRAMUSCULAR | Status: AC
  Filled 2017-12-20: qty 2

## 2017-12-20 MED ORDER — CEFAZOLIN SODIUM-DEXTROSE 2-4 GM/100ML-% IV SOLN
INTRAVENOUS | Status: AC
  Filled 2017-12-20: qty 100

## 2017-12-20 MED ORDER — HYDROCODONE-ACETAMINOPHEN 5-325 MG PO TABS: 1.0000 | ORAL_TABLET | Freq: Four times a day (QID) | ORAL | 0 refills | Status: DC | PRN

## 2017-12-20 MED ORDER — PROMETHAZINE HCL 25 MG/ML IJ SOLN: 6.2500 mg | INTRAMUSCULAR | Status: DC | PRN

## 2017-12-20 MED ORDER — ONDANSETRON HCL 4 MG/2ML IJ SOLN
INTRAMUSCULAR | Status: AC
  Filled 2017-12-20: qty 2

## 2017-12-20 MED ORDER — CHLORHEXIDINE GLUCONATE 4 % EX LIQD: 60.0000 mL | Freq: Once | CUTANEOUS | Status: DC

## 2017-12-20 MED ORDER — LIDOCAINE HCL (PF) 0.5 % IJ SOLN
INTRAMUSCULAR | Status: DC | PRN
  Administered 2017-12-20: 50 mL via INTRAVENOUS

## 2017-12-20 MED ORDER — FENTANYL CITRATE (PF) 100 MCG/2ML IJ SOLN
50.0000 ug | INTRAMUSCULAR | Status: DC | PRN
  Administered 2017-12-20 (×2): 50 ug via INTRAVENOUS

## 2017-12-20 MED ORDER — PROPOFOL 10 MG/ML IV BOLUS
INTRAVENOUS | Status: AC
  Filled 2017-12-20: qty 40

## 2017-12-20 MED ORDER — DEXAMETHASONE SODIUM PHOSPHATE 10 MG/ML IJ SOLN
INTRAMUSCULAR | Status: AC
  Filled 2017-12-20: qty 1

## 2017-12-20 MED ORDER — LACTATED RINGERS IV SOLN
INTRAVENOUS | Status: DC
  Administered 2017-12-20: 15:00:00 via INTRAVENOUS

## 2017-12-20 MED ORDER — ACETAMINOPHEN 10 MG/ML IV SOLN: 1000.0000 mg | Freq: Once | INTRAVENOUS | Status: DC | PRN

## 2017-12-20 MED ORDER — ONDANSETRON HCL 4 MG/2ML IJ SOLN
INTRAMUSCULAR | Status: DC | PRN
  Administered 2017-12-20: 4 mg via INTRAVENOUS

## 2017-12-20 MED ORDER — MIDAZOLAM HCL 2 MG/2ML IJ SOLN
INTRAMUSCULAR | Status: AC
  Filled 2017-12-20: qty 2

## 2017-12-20 MED ORDER — CEFAZOLIN SODIUM-DEXTROSE 2-4 GM/100ML-% IV SOLN
2.0000 g | INTRAVENOUS | Status: AC
  Administered 2017-12-20: 2 g via INTRAVENOUS

## 2017-12-20 MED ORDER — FENTANYL CITRATE (PF) 100 MCG/2ML IJ SOLN: 25.0000 ug | INTRAMUSCULAR | Status: DC | PRN

## 2017-12-20 MED ORDER — MIDAZOLAM HCL 2 MG/2ML IJ SOLN
1.0000 mg | INTRAMUSCULAR | Status: DC | PRN
  Administered 2017-12-20: 1 mg via INTRAVENOUS

## 2017-12-20 MED ORDER — SCOPOLAMINE 1 MG/3DAYS TD PT72: 1.0000 | MEDICATED_PATCH | Freq: Once | TRANSDERMAL | Status: DC | PRN

## 2017-12-20 MED ORDER — BUPIVACAINE HCL (PF) 0.25 % IJ SOLN
INTRAMUSCULAR | Status: DC | PRN
  Administered 2017-12-20: 7 mL

## 2017-12-20 SURGICAL SUPPLY — 66 items
BAG DECANTER FOR FLEXI CONT (MISCELLANEOUS) IMPLANT
BLADE MINI RND TIP GREEN BEAV (BLADE) ×3 IMPLANT
BLADE SURG 15 STRL LF DISP TIS (BLADE) ×1 IMPLANT
BLADE SURG 15 STRL SS (BLADE) ×2
BNDG COHESIVE 3X5 TAN STRL LF (GAUZE/BANDAGES/DRESSINGS) ×3 IMPLANT
BNDG ESMARK 4X9 LF (GAUZE/BANDAGES/DRESSINGS) ×3 IMPLANT
BNDG GAUZE ELAST 4 BULKY (GAUZE/BANDAGES/DRESSINGS) ×3 IMPLANT
CHLORAPREP W/TINT 26ML (MISCELLANEOUS) ×3 IMPLANT
CORD BIPOLAR FORCEPS 12FT (ELECTRODE) ×3 IMPLANT
COTTONBALL LRG STERILE PKG (GAUZE/BANDAGES/DRESSINGS) IMPLANT
COVER BACK TABLE 60X90IN (DRAPES) ×3 IMPLANT
COVER MAYO STAND STRL (DRAPES) ×3 IMPLANT
CUFF TOURNIQUET SINGLE 18IN (TOURNIQUET CUFF) ×3 IMPLANT
DECANTER SPIKE VIAL GLASS SM (MISCELLANEOUS) IMPLANT
DRAIN TLS ROUND 10FR (DRAIN) IMPLANT
DRAPE EXTREMITY T 121X128X90 (DRAPE) ×3 IMPLANT
DRAPE OEC MINIVIEW 54X84 (DRAPES) IMPLANT
DRAPE SURG 17X23 STRL (DRAPES) ×3 IMPLANT
GAUZE SPONGE 4X4 12PLY STRL (GAUZE/BANDAGES/DRESSINGS) ×3 IMPLANT
GAUZE SPONGE 4X4 16PLY XRAY LF (GAUZE/BANDAGES/DRESSINGS) IMPLANT
GAUZE XEROFORM 1X8 LF (GAUZE/BANDAGES/DRESSINGS) ×3 IMPLANT
GLOVE BIO SURGEON STRL SZ7.5 (GLOVE) ×3 IMPLANT
GLOVE BIOGEL PI IND STRL 8.5 (GLOVE) ×1 IMPLANT
GLOVE BIOGEL PI INDICATOR 8.5 (GLOVE) ×2
GLOVE SURG ORTHO 8.0 STRL STRW (GLOVE) ×3 IMPLANT
GOWN STRL REUS W/ TWL LRG LVL3 (GOWN DISPOSABLE) ×1 IMPLANT
GOWN STRL REUS W/TWL LRG LVL3 (GOWN DISPOSABLE) ×2
GOWN STRL REUS W/TWL XL LVL3 (GOWN DISPOSABLE) ×3 IMPLANT
LOOP VESSEL MAXI BLUE (MISCELLANEOUS) IMPLANT
NEEDLE HYPO 22GX1.5 SAFETY (NEEDLE) IMPLANT
NEEDLE KEITH (NEEDLE) IMPLANT
NEEDLE PRECISIONGLIDE 27X1.5 (NEEDLE) ×3 IMPLANT
NS IRRIG 1000ML POUR BTL (IV SOLUTION) ×3 IMPLANT
PACK BASIN DAY SURGERY FS (CUSTOM PROCEDURE TRAY) ×3 IMPLANT
PAD CAST 3X4 CTTN HI CHSV (CAST SUPPLIES) ×1 IMPLANT
PAD CAST 4YDX4 CTTN HI CHSV (CAST SUPPLIES) IMPLANT
PADDING CAST ABS 3INX4YD NS (CAST SUPPLIES)
PADDING CAST ABS 4INX4YD NS (CAST SUPPLIES) ×2
PADDING CAST ABS COTTON 3X4 (CAST SUPPLIES) IMPLANT
PADDING CAST ABS COTTON 4X4 ST (CAST SUPPLIES) ×1 IMPLANT
PADDING CAST COTTON 3X4 STRL (CAST SUPPLIES) ×2
PADDING CAST COTTON 4X4 STRL (CAST SUPPLIES)
SLEEVE SCD COMPRESS KNEE MED (MISCELLANEOUS) IMPLANT
SPLINT PLASTER CAST XFAST 3X15 (CAST SUPPLIES) IMPLANT
SPLINT PLASTER XTRA FASTSET 3X (CAST SUPPLIES)
STOCKINETTE 4X48 STRL (DRAPES) ×3 IMPLANT
SUT CHROMIC 5 0 P 3 (SUTURE) IMPLANT
SUT ETHIBOND 3-0 V-5 (SUTURE) IMPLANT
SUT ETHILON 4 0 PS 2 18 (SUTURE) ×3 IMPLANT
SUT MERSILENE 2.0 SH NDLE (SUTURE) IMPLANT
SUT MERSILENE 3 0 FS 1 (SUTURE) IMPLANT
SUT MERSILENE 4 0 P 3 (SUTURE) IMPLANT
SUT PROLENE 2 0 SH DA (SUTURE) IMPLANT
SUT SILK 4 0 PS 2 (SUTURE) IMPLANT
SUT STEEL 3 0 (SUTURE) IMPLANT
SUT VIC AB 3-0 PS1 18 (SUTURE)
SUT VIC AB 3-0 PS1 18XBRD (SUTURE) IMPLANT
SUT VIC AB 4-0 P-3 18XBRD (SUTURE) IMPLANT
SUT VIC AB 4-0 P2 18 (SUTURE) ×3 IMPLANT
SUT VIC AB 4-0 P3 18 (SUTURE)
SUT VICRYL 4-0 PS2 18IN ABS (SUTURE) ×3 IMPLANT
SYR BULB 3OZ (MISCELLANEOUS) ×3 IMPLANT
SYR CONTROL 10ML LL (SYRINGE) IMPLANT
TOWEL OR 17X24 6PK STRL BLUE (TOWEL DISPOSABLE) ×6 IMPLANT
TUBE FEEDING ENTERAL 5FR 16IN (TUBING) IMPLANT
UNDERPAD 30X30 (UNDERPADS AND DIAPERS) ×3 IMPLANT

## 2017-12-20 NOTE — Anesthesia Procedure Notes (Signed)
Anesthesia Regional Block: Bier block (IV Regional)   Pre-Anesthetic Checklist: ,, timeout performed, Correct Patient, Correct Site, Correct Laterality, Correct Procedure, Correct Position, site marked, Risks and benefits discussed,  Surgical consent,  Pre-op evaluation,  At surgeon's request and post-op pain management  Procedures:,,,,,,,, #20gu IV placed  Narrative:  CRNA: Verita Lamb, CRNA

## 2017-12-20 NOTE — Anesthesia Preprocedure Evaluation (Addendum)
Anesthesia Evaluation  Patient identified by MRN, date of birth, ID band Patient awake    Reviewed: Allergy & Precautions, NPO status , Patient's Chart, lab work & pertinent test results  Airway Mallampati: III  TM Distance: <3 FB Neck ROM: Limited    Dental no notable dental hx.    Pulmonary sleep apnea , former smoker,    Pulmonary exam normal breath sounds clear to auscultation       Cardiovascular hypertension, Normal cardiovascular exam Rhythm:Regular Rate:Normal     Neuro/Psych negative neurological ROS  negative psych ROS   GI/Hepatic Neg liver ROS, GERD  Medicated,  Endo/Other  diabetes  Renal/GU negative Renal ROS  negative genitourinary   Musculoskeletal negative musculoskeletal ROS (+)   Abdominal   Peds negative pediatric ROS (+)  Hematology negative hematology ROS (+)   Anesthesia Other Findings   Reproductive/Obstetrics negative OB ROS                             Anesthesia Physical Anesthesia Plan  ASA: III  Anesthesia Plan: Bier Block and MAC and Bier Block-LIDOCAINE ONLY   Post-op Pain Management:    Induction: Intravenous  PONV Risk Score and Plan: 1 and Ondansetron and Treatment may vary due to age or medical condition  Airway Management Planned: Simple Face Mask  Additional Equipment:   Intra-op Plan:   Post-operative Plan:   Informed Consent: I have reviewed the patients History and Physical, chart, labs and discussed the procedure including the risks, benefits and alternatives for the proposed anesthesia with the patient or authorized representative who has indicated his/her understanding and acceptance.   Dental advisory given  Plan Discussed with: CRNA and Surgeon  Anesthesia Plan Comments:        Anesthesia Quick Evaluation

## 2017-12-20 NOTE — H&P (Signed)
[name] is an 72 y.o. male.   Chief Complaint: mass right wrist [id number] is a 72 year old right-hand-dominant male referred by Dr. Hardin Negus for consultation regarding a mass on the volar radial aspect of his right wrist. Is been present for at least 4 months. Localizes pain on the radial aspect of his wrist going up his forearm. He recalls no history of injury. Is not had any treatment for this. He complains of pain throbbing in nature sharp with a VAS score 10/10. He is seeing Dr. Hardin Negus for chronic pain in his back. He has had a carpal tunnel release done on his right side by Dr. Vida Rigger. He has a history of diabetes arthritis no history of thyroid problems or gout. Family history is positive diabetes negative for the remainder. He is sent for an ultrasound.His ultrasound is read by Dr. Nelia Shi. This reveals a high-grade tendinitis of the flexor carpi radialis tendon          Past Medical History:  Diagnosis Date  . Diabetes mellitus without complication ([place])   . Diverticulitis   . GERD (gastroesophageal reflux disease)   . Hypertension   . Hypertriglyceridemia   . Insomnia, transient   . Major depression   . Melanoma ([place])    R Arm, s/p excision 11/2009  . Osteopenia   . Peptic ulcer disease   . PVD (peripheral vascular disease) ([place])   . Sleep apnea, obstructive   . Spinal stenosis     Past Surgical History:  Procedure Laterality Date  . APPENDECTOMY    . CARPAL TUNNEL RELEASE    . CERVICAL FUSION    . ELBOW SURGERY    . HERNIA REPAIR    . lumbar spinal stenosis correction    . SHOULDER SURGERY      Family History  Problem Relation Age of Onset  . Throat cancer Father    Social History:  reports that he has quit smoking. He has never used smokeless tobacco. He reports that he does not drink alcohol or use drugs.  Allergies:  Allergies  Allergen Reactions  . Feldene [Piroxicam] Swelling    Medications Prior to Admission  Medication Sig Dispense Refill   . baclofen (LIORESAL) 10 MG tablet Take 10 mg by mouth 3 (three) times daily.    . benazepril-hydrochlorthiazide (LOTENSIN HCT) 20-12.5 MG per tablet Take 1 tablet by mouth daily.    . Cholecalciferol (VITAMIN D3) 2000 UNITS TABS Take 2 capsules by mouth 2 (two) times daily.    . DULoxetine (CYMBALTA) 60 MG capsule Take 60 mg by mouth daily.    . fenofibrate micronized (LOFIBRA) 200 MG capsule Take 200 mg by mouth daily before breakfast.    . metFORMIN (GLUCOPHAGE) 500 MG tablet Take 500 mg by mouth at bedtime.    . metoprolol tartrate (LOPRESSOR) 25 MG tablet Take 25 mg by mouth 2 (two) times daily.    . Multiple Vitamin (MULTIVITAMIN WITH MINERALS) TABS Take 1 tablet by mouth daily.    Marland Kitchen omega-3 acid ethyl esters (LOVAZA) 1 G capsule Take 2 g by mouth 2 (two) times daily.    Marland Kitchen oxyCODONE-acetaminophen (PERCOCET) 7.5-500 MG per tablet Take 1 tablet by mouth every 4 (four) hours as needed. Pain    . pantoprazole (PROTONIX) 40 MG tablet Take 40 mg by mouth daily.    . simvastatin (ZOCOR) 20 MG tablet Take 20 mg by mouth every evening.    . meclizine (ANTIVERT) 50 MG tablet Take 0.5 tablets (25 mg total)  by mouth 3 (three) times daily as needed. 30 tablet 0  . zolpidem (AMBIEN) 10 MG tablet Take 10 mg by mouth at bedtime as needed.      Results for orders placed or performed during the hospital encounter of 12/20/17 (from the past 48 hour(s))  I-STAT, chem 8     Status: Abnormal   Collection Time: 12/20/17  2:28 PM  Result Value Ref Range   Sodium 143 135 - 145 mmol/L   Potassium 4.0 3.5 - 5.1 mmol/L   Chloride 102 101 - 111 mmol/L   BUN 23 (H) 6 - 20 mg/dL   Creatinine, Ser 1.30 (H) 0.61 - 1.24 mg/dL   Glucose, Bld 108 (H) 65 - 99 mg/dL   Calcium, Ion 1.22 1.15 - 1.40 mmol/L   TCO2 27 22 - 32 mmol/L   Hemoglobin 13.6 13.0 - 17.0 g/dL   HCT 40.0 39.0 - 52.0 %    No results found.   Pertinent items are noted in HPI.  Blood pressure (!) 146/86, pulse 76, temperature 98.2 F (36.8  C), temperature source Oral, height 5' 8.5" (1.74 m), weight 84.1 kg (185 lb 8 oz), SpO2 98 %.  General appearance: alert, cooperative and appears stated age Head: Normocephalic, without obvious abnormality Neck: no JVD Resp: clear to auscultation bilaterally Cardio: regular rate and rhythm, S1, S2 normal, no murmur, click, rub or gallop GI: soft, non-tender; bowel sounds normal; no masses,  no organomegaly Extremities: mass right wrist Pulses: 2+ and symmetric Skin: Skin color, texture, turgor normal. No rashes or lesions Neurologic: Grossly normal Incision/Wound: na  Assessment/Plan Assessment:   Diagnosis carpi radialis tendinitis. Asymptomatic CMC arthritis       Plan: We have discussed surgically debriding the FCR tendon and STT CMC joint area. Is aware that there is no guarantee to the surgery the possibility of infection recurrence injury to arteries nerves tendon rupture of the FCR tendon this done in attempt to maintain it. I would not recommend treatment of the [facility] joint with reconstruction at this point time and that he has no pain to that side. He is complaining of some discomfort in the [facility] joint area of his left wrist. He would like to proceed. He is scheduled for debridement of FCR tendon sheath as an outpatient under regional anesthesia right hand.      Bradford Cazier R 12/20/2017, 2:53 PM

## 2017-12-20 NOTE — Op Note (Signed)
Other Dictation: Dictation Number [id number]

## 2017-12-20 NOTE — Transfer of Care (Signed)
Immediate Anesthesia Transfer of Care Note  Patient: [name]  Procedure(s) Performed: DEBRIDEMENT RELEASE FLEXOR CARPI RADIALIS SHEATH RIGHT WRIST (Right Wrist)  Patient Location: PACU  Anesthesia Type:MAC  Level of Consciousness: awake, alert  and oriented  Airway & Oxygen Therapy: Patient Spontanous Breathing and Patient connected to face mask oxygen  Post-op Assessment: Report given to RN and Post -op Vital signs reviewed and stable  Post vital signs: Reviewed and stable  Last Vitals:  Vitals Value Taken Time  BP    Temp    Pulse    Resp    SpO2      Last Pain:  Vitals:   12/20/17 1610  TempSrc:   PainSc: 8          Complications: No apparent anesthesia complications

## 2017-12-20 NOTE — Brief Op Note (Signed)
12/20/2017  6:11 PM  PATIENT:  [name]  72 y.o. male  PRE-OPERATIVE DIAGNOSIS:  FLEXOR CARPI RADIALIS TENDONITIS RIGHT WRIST  POST-OPERATIVE DIAGNOSIS:  FLEXOR CARPI RADIALIS TENDONITIS RIGHT WRIST  PROCEDURE:  Procedure(s) with comments: DEBRIDEMENT RELEASE FLEXOR CARPI RADIALIS SHEATH RIGHT WRIST (Right) - UPPER ARM  SURGEON:  Surgeon(s) and Role:    * Daryll Brod, MD - Primary  PHYSICIAN ASSISTANT:   ASSISTANTS: none   ANESTHESIA:   local and regional  EBL: 78ml BLOOD ADMINISTERED:none  DRAINS: none   LOCAL MEDICATIONS USED:  BUPIVICAINE   SPECIMEN:  No Specimen  DISPOSITION OF SPECIMEN:  N/A  COUNTS:  YES  TOURNIQUET:   Total Tourniquet Time Documented: Upper Arm (Right) - 32 minutes Total: Upper Arm (Right) - 32 minutes   DICTATION: .Other Dictation: Dictation Number [id number]  PLAN OF CARE: Discharge to home after PACU  PATIENT DISPOSITION:  PACU - hemodynamically stable.

## 2017-12-20 NOTE — Op Note (Signed)
[name], [name] NO.:  [id number]  MEDICAL RECORD NO.:  [id number]  LOCATION:                                 FACILITY:  PHYSICIAN:  Daryll Brod, M.D.       DATE OF BIRTH:  [birth year]  DATE OF PROCEDURE:  12/20/2017 DATE OF DISCHARGE:                              OPERATIVE REPORT   PREOPERATIVE DIAGNOSES:  Flexor carpi radialis tendinitis, pantrapezial arthritis.  POSTOPERATIVE DIAGNOSES:  Flexor carpi radialis tendinitis, pantrapezial arthritis.  OPERATION:  Release of flexor carpi radialis tendon with debridement of STT joint, right wrist.  SURGEON:  Daryll Brod, MD.  ASSISTANT:  None.  ANESTHESIA:  Upper arm IV regional with local infiltration.  PLACE OF SURGERY:  [facility].  ANESTHESIOLOGIST:  Rose.  HISTORY:  The patient is a 72 year old male with a history of pain and swelling on the volar radial aspect of his right wrist.  Ultrasound reveals a high-grade tendinitis of the flexor carpi radialis tendon with a large amount of fluid accumulation in the sheath.  He has elected to undergo debridement of the area with release of the flexor carpi radialis tendon.  Pre, peri, and postoperative course have been discussed along with risks and complications.  He is aware there is no guarantee to the surgery, the possibility of infection, recurrence of injury to arteries, nerves, and tendons, incomplete relief of symptoms, and dystrophy.  In the preoperative area, the patient is seen, the extremity marked by both patient and surgeon, antibiotic given.  DESCRIPTION OF PROCEDURE:  The patient was brought to the operating room where upper arm IV regional anesthetic was carried out without difficulty.  He was prepped using ChloraPrep in a supine position with the right arm free.  A 3-minute dry time was allowed and time-out was taken confirming the patient and procedure.  Local infiltration was given with 0.25% bupivacaine without epinephrine  at the beginning of the procedure and end of the procedure.  A total of approximately 8 mL was used.  A longitudinal incision was made along with flexor carpi radialis and carried across the flexor crease in a zigzag manner, carried down through subcutaneous tissue.  A large swelling of the flexor carpi radialis tendon sheath was encountered with significant attrition of the tendon along the entire course from the distal forearm.  The volar branch of the radial artery was identified and protected.  The dissection was carried through the sheath up into the of the thenar musculature where an erosion through the capsule of the STT joint was noted with a high-grade tearing of the flexor carpi radialis tendon. This area was debrided with a rongeur, removing osteophytes from the head of the distal scaphoid and distal tubercle.  The area was irrigated.  The capsule was then repaired with horizontal mattress and figure-of-eight 4-0 Vicryl sutures.  No further lesions were identified. The wound was irrigated.  The subcutaneous tissue was closed with interrupted 4-0 Vicryl and the skin with interrupted 4-0 nylon sutures.  A sterile compressive dressing and volar splint were applied.  On deflation of the tourniquet, all fingers immediately pinked.  He was taken to the recovery room  for observation in satisfactory condition.  He will be discharged to home, to return to the [place] in 1 week, on [place].          ______________________________ Daryll Brod, M.D.     GK/MEDQ  D:  12/20/2017  T:  12/20/2017  Job:  [id number]

## 2017-12-20 NOTE — Discharge Instructions (Addendum)

## 2017-12-23 ENCOUNTER — Encounter (HOSPITAL_BASED_OUTPATIENT_CLINIC_OR_DEPARTMENT_OTHER): Payer: Self-pay | Admitting: Orthopedic Surgery

## 2017-12-23 NOTE — Anesthesia Postprocedure Evaluation (Signed)
Anesthesia Post Note  Patient: [name]  Procedure(s) Performed: DEBRIDEMENT RELEASE FLEXOR CARPI RADIALIS SHEATH RIGHT WRIST (Right Wrist)     Patient location during evaluation: PACU Anesthesia Type: MAC and Bier Block Level of consciousness: awake and alert Pain management: pain level controlled Vital Signs Assessment: post-procedure vital signs reviewed and stable Respiratory status: spontaneous breathing, nonlabored ventilation, respiratory function stable and patient connected to nasal cannula oxygen Cardiovascular status: stable and blood pressure returned to baseline Postop Assessment: no apparent nausea or vomiting Anesthetic complications: no    Last Vitals:  Vitals:   12/20/17 1830 12/20/17 1845  BP: (!) 144/92 (!) 153/97  Pulse: 70 75  Resp: 15 17  Temp: 37.4 C   SpO2: 96% 96%    Last Pain:  Vitals:   12/20/17 1845  TempSrc:   PainSc: 2                  Rocky Rishel S

## 2018-04-14 IMAGING — DX DG LUMBAR SPINE COMPLETE 4+V
5 series · 5 of 5 positions shown · non-contrast
Comparison: CT of the abdomen and pelvis 08/07/2012

CLINICAL DATA: Fall.  Back pain.

EXAM:
LUMBAR SPINE - COMPLETE 4+ VIEW

[l-spine ap]
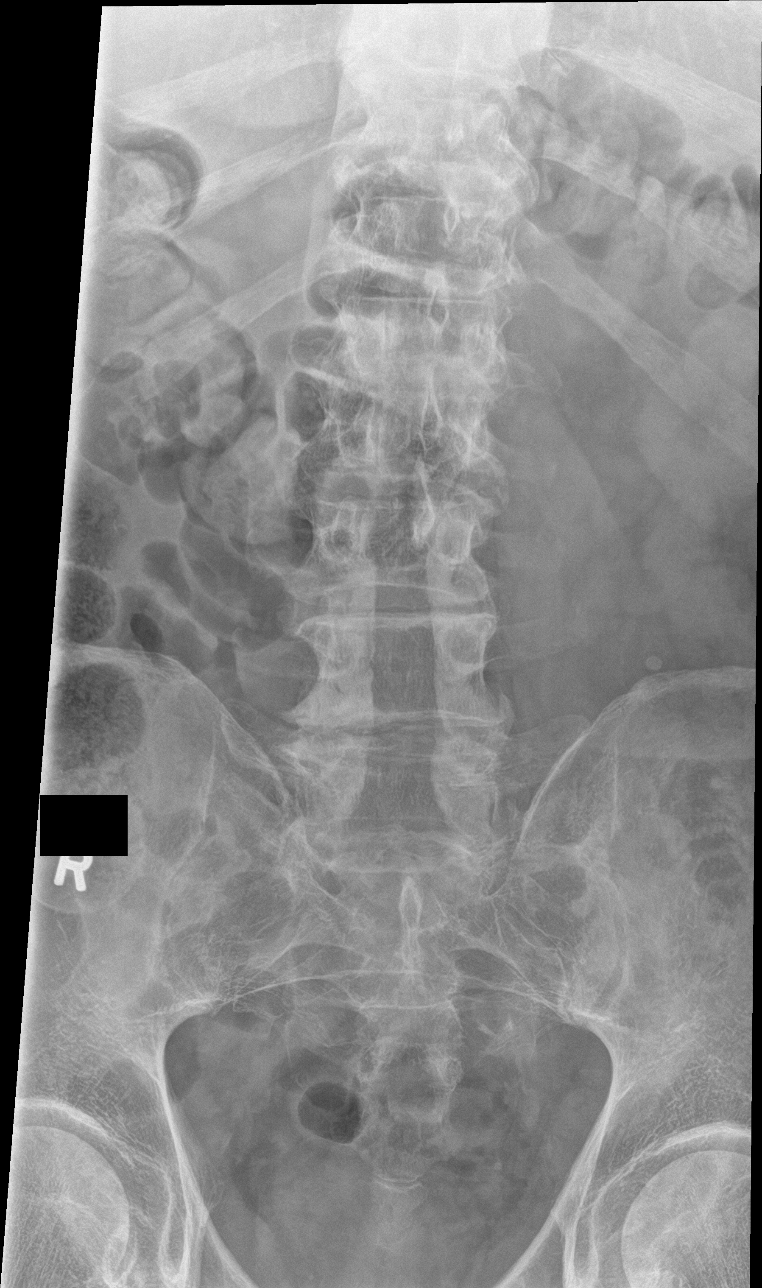

[l-spine obl (1 of 2)]
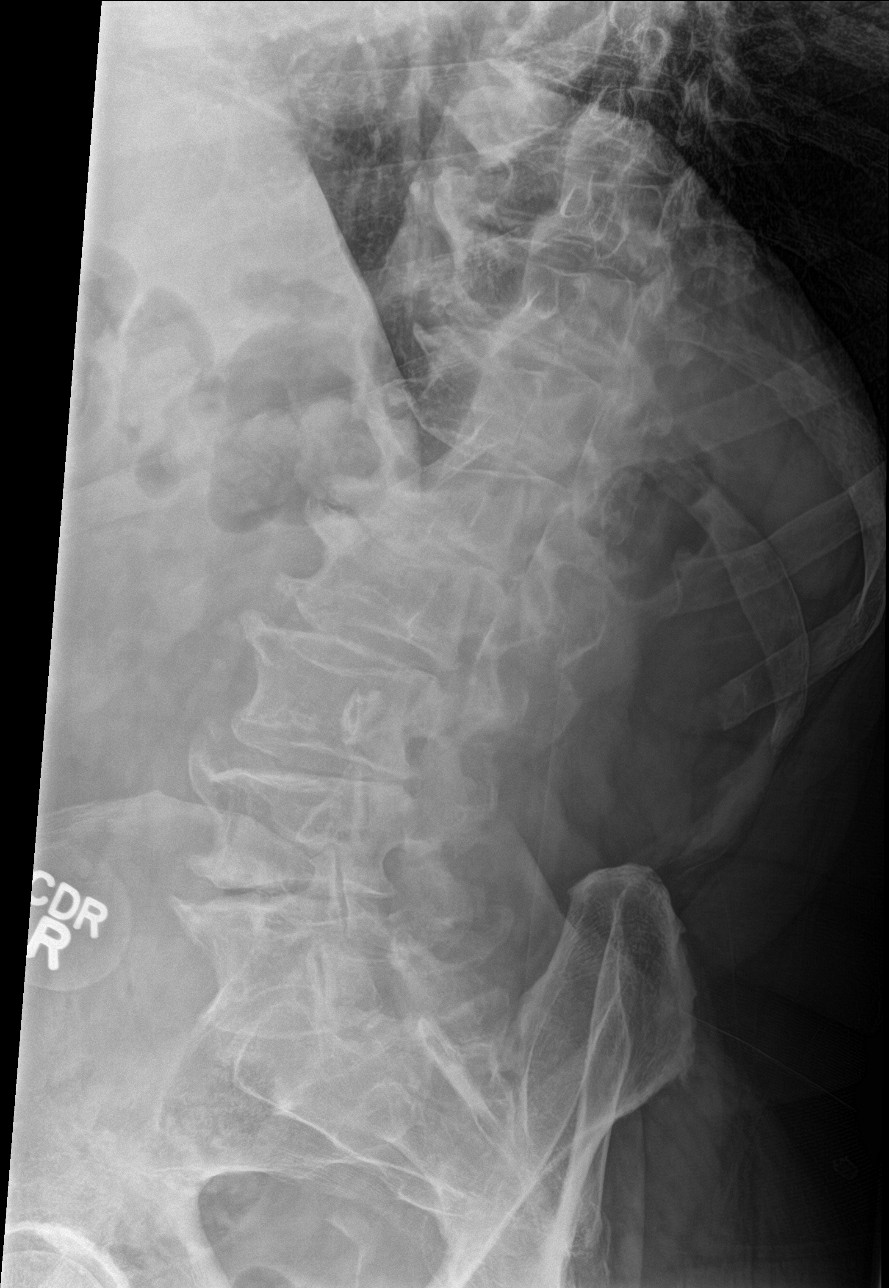

[l-spine obl (2 of 2)]
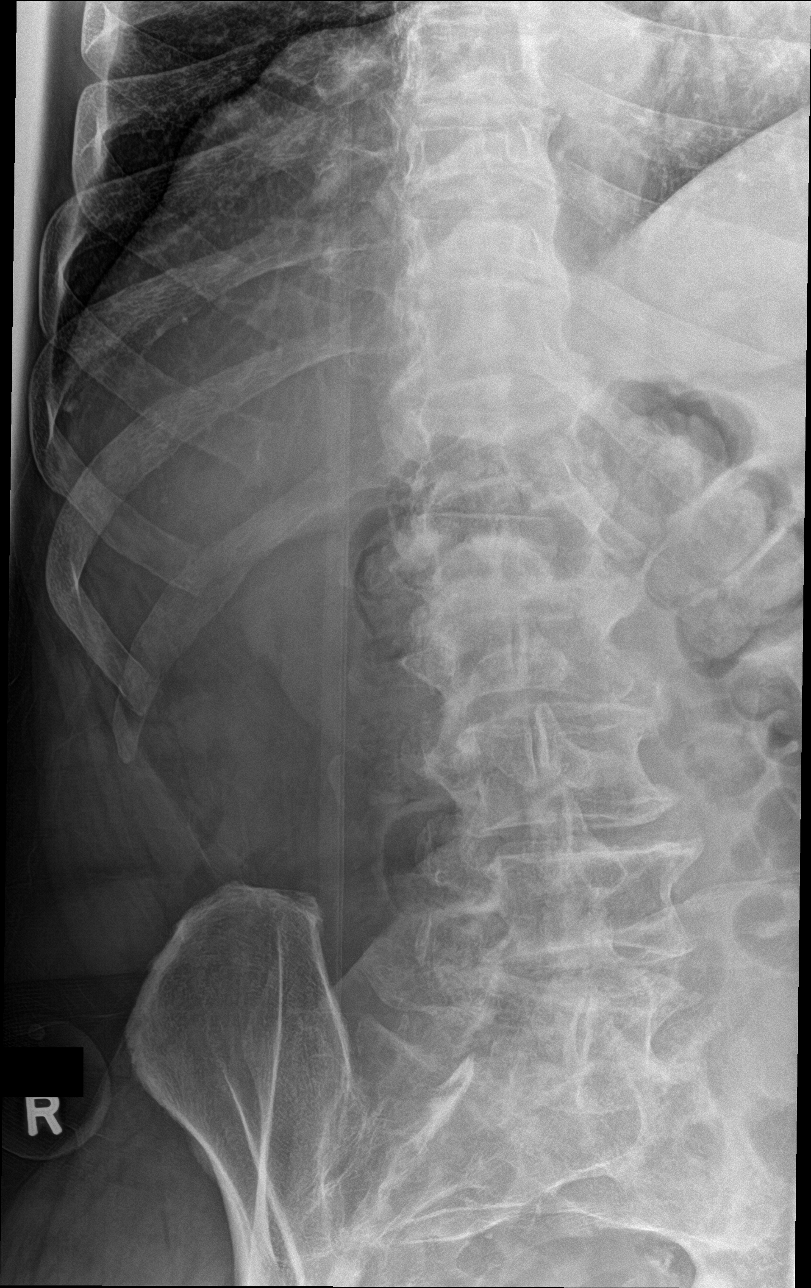

[l-spine lat]
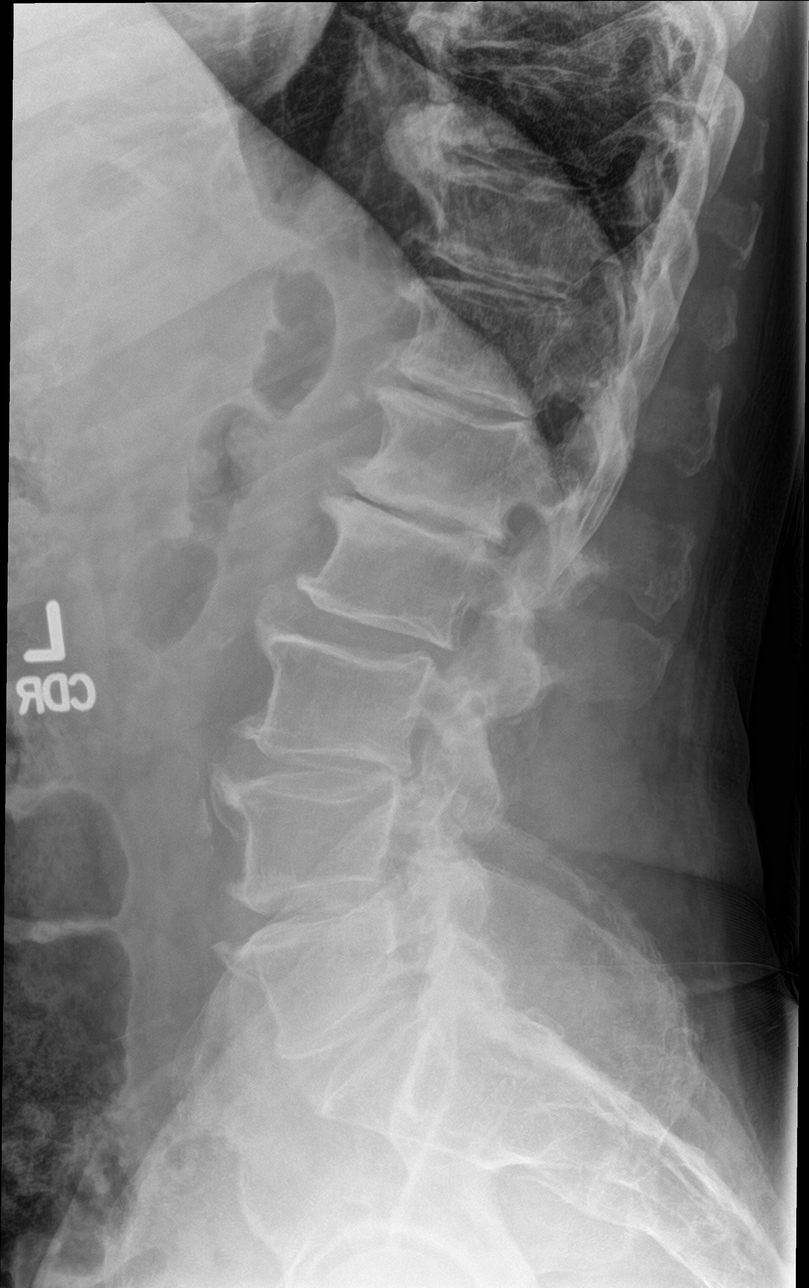

[l-spine spot]
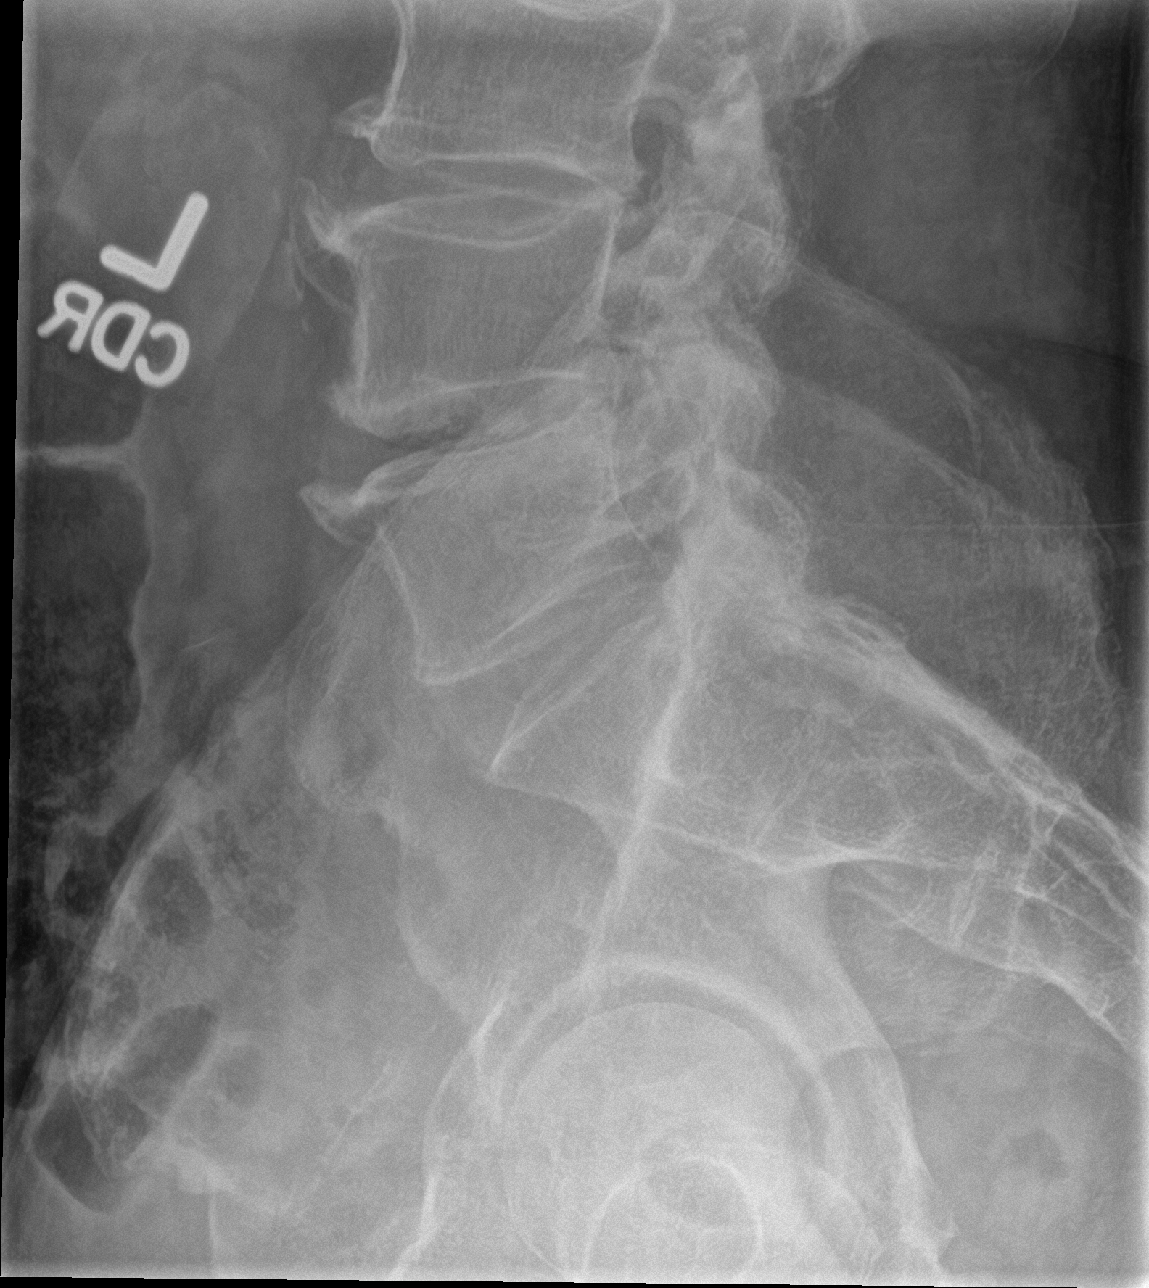

[5 of 5 positions shown; findings below may reference images not displayed]

FINDINGS: Previous laminectomies are again noted at L3, L4, and L5. Chronic
endplate degenerative changes are present. Slight retrolisthesis at
L2-3 and L3-4 is stable. Vertebral body heights are preserved. No
acute fracture traumatic subluxation is present.
IMPRESSION: 1. No acute abnormality.
2. Stable multilevel degenerative change.
3. Laminectomies at L3, L4, and L5.

## 2018-04-14 IMAGING — DX DG THORACIC SPINE 3V
3 series · 3 of 3 positions shown · non-contrast
Comparison: CT of the thoracic spine 05/10/2014

CLINICAL DATA: Fall.  Back pain.

EXAM:
THORACIC SPINE - 3 VIEWS

[t-spine ap]
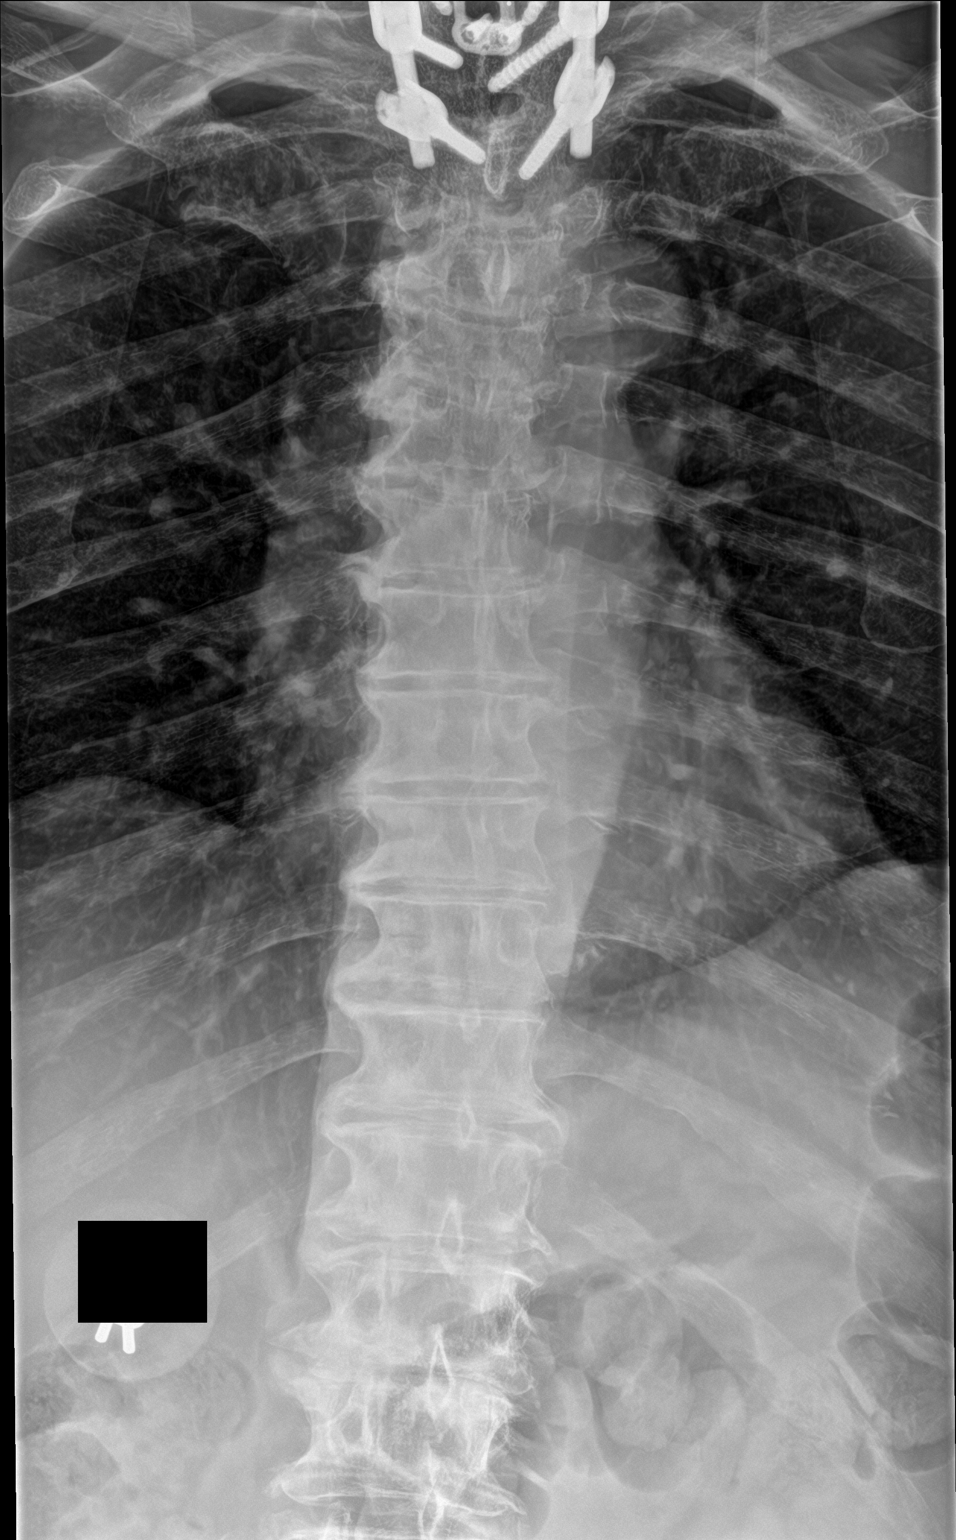

[t-spine lat]
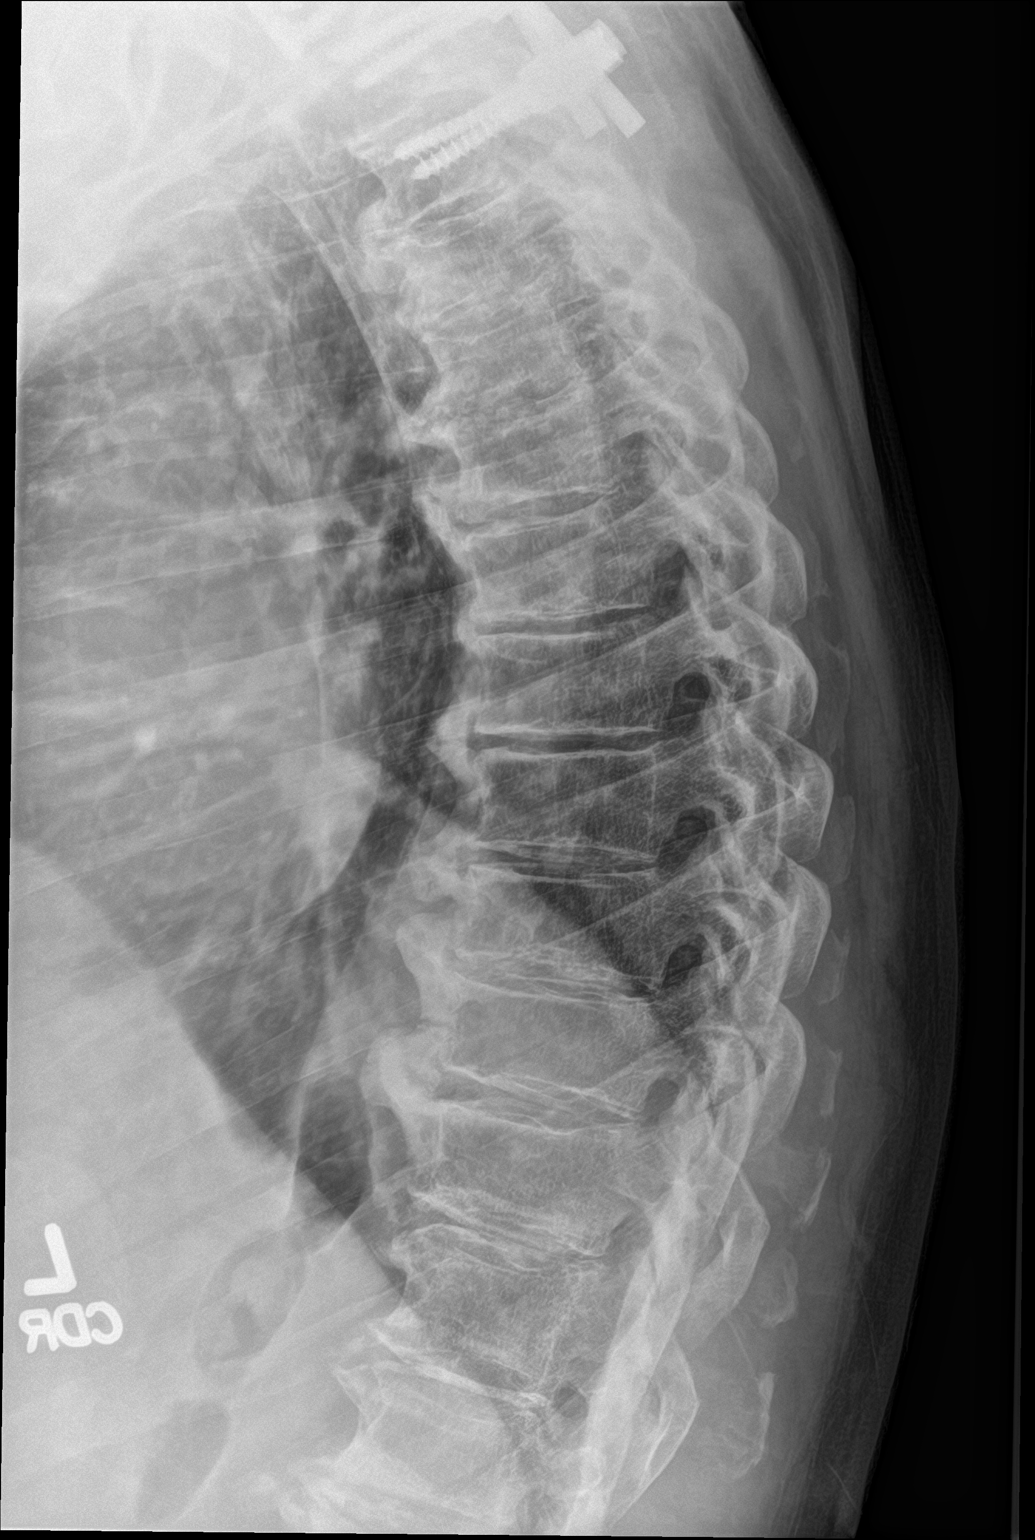

[t-spine swimmers]
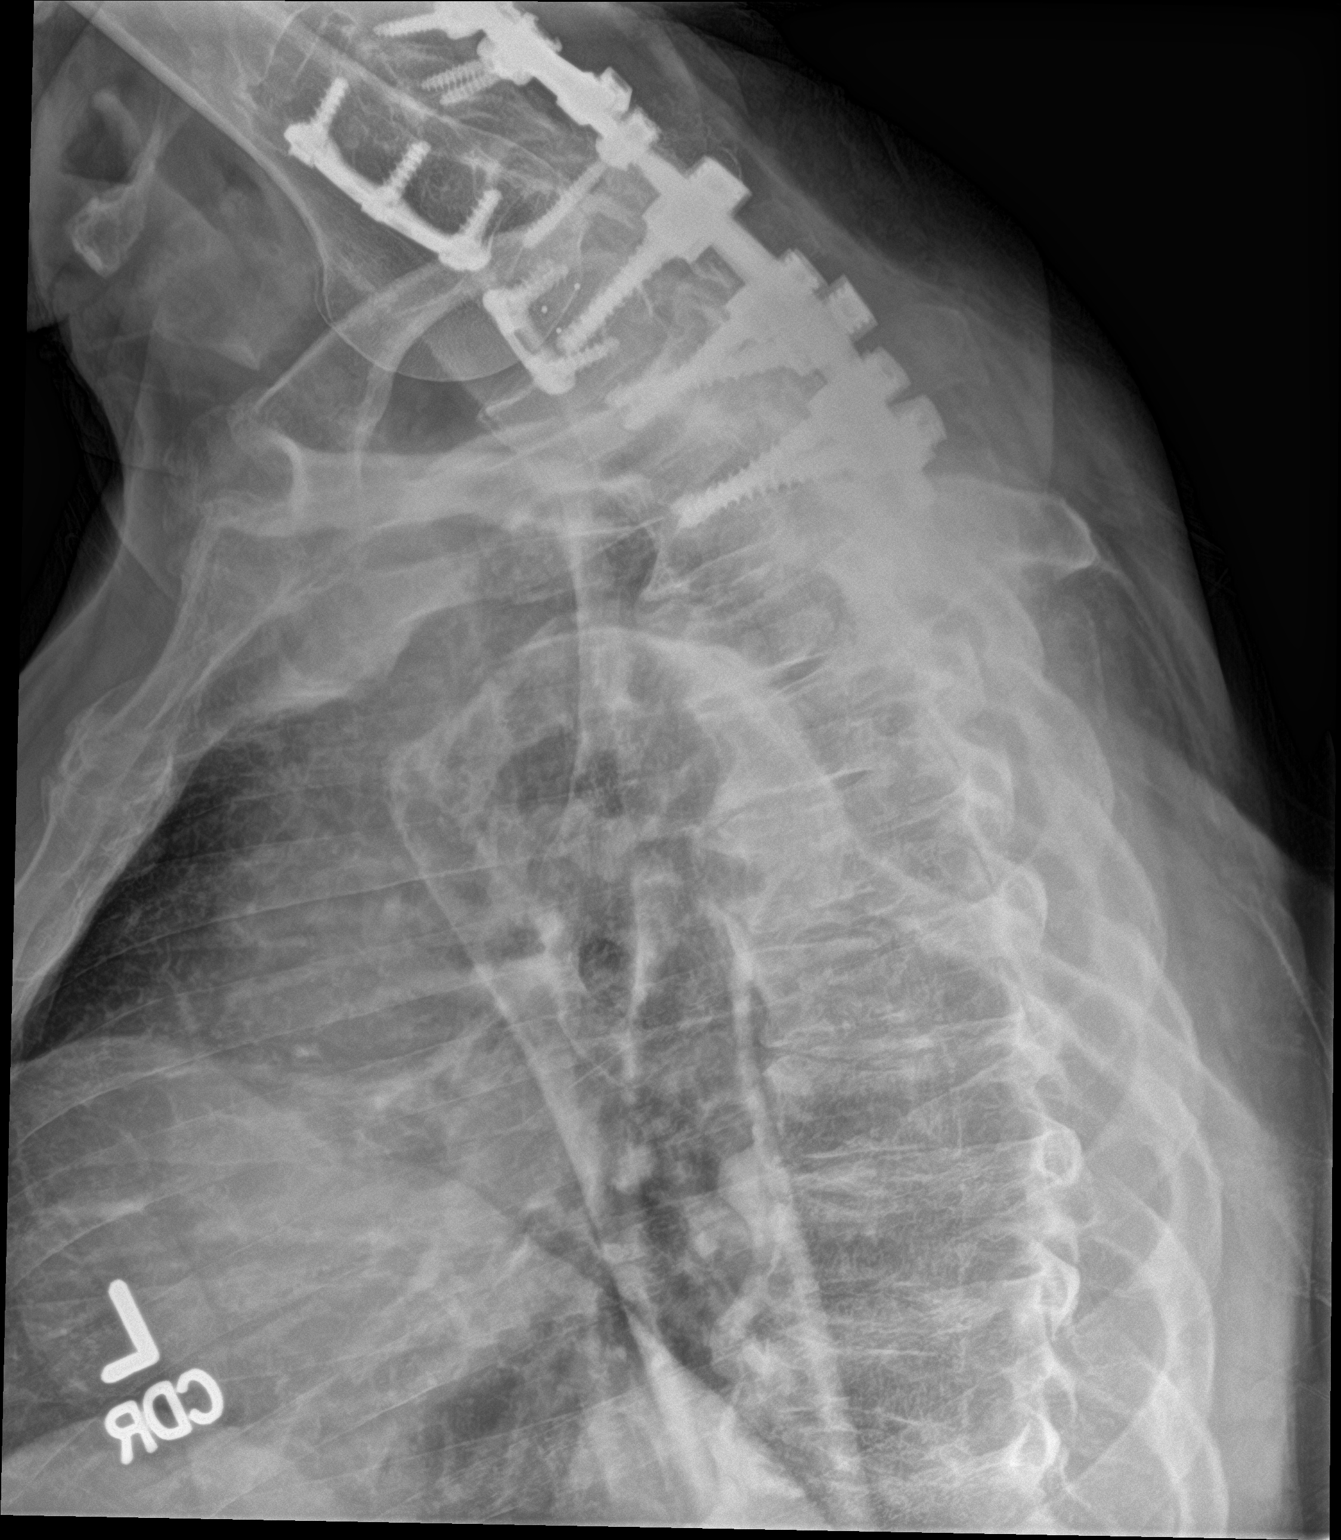

[3 of 3 positions shown; findings below may reference images not displayed]

FINDINGS: Cervicothoracic fusion extends to the T1 level. Chronic endplate
degenerative changes are present below this level. No acute
fractures present. Vertebral body heights are preserved. Proximal
ribs are intact.
IMPRESSION: 1. No acute abnormality.
2. Multilevel degenerative changes are stable.
3. Cervical fusion extends to the T1 level.

## 2018-09-22 ENCOUNTER — Other Ambulatory Visit: Payer: Self-pay | Admitting: Orthopedic Surgery

## 2018-10-06 IMAGING — US US EXTREM UP*R* COMP
1 series · 8 of 8 positions shown · non-contrast
Comparison: None.

CLINICAL DATA: Volar wrist mass.

EXAM:
ULTRASOUND RIGHT UPPER EXTREMITY COMPLETE
TECHNIQUE: Ultrasound examination of the right wrist was performed including
evaluation of the muscles, tendons, joint, and adjacent soft
tissues.

[Series 1: us extrem up*right* comp · 0.03mm/px · 8 acquisitions, 8 frames shown]
[im 1/8]
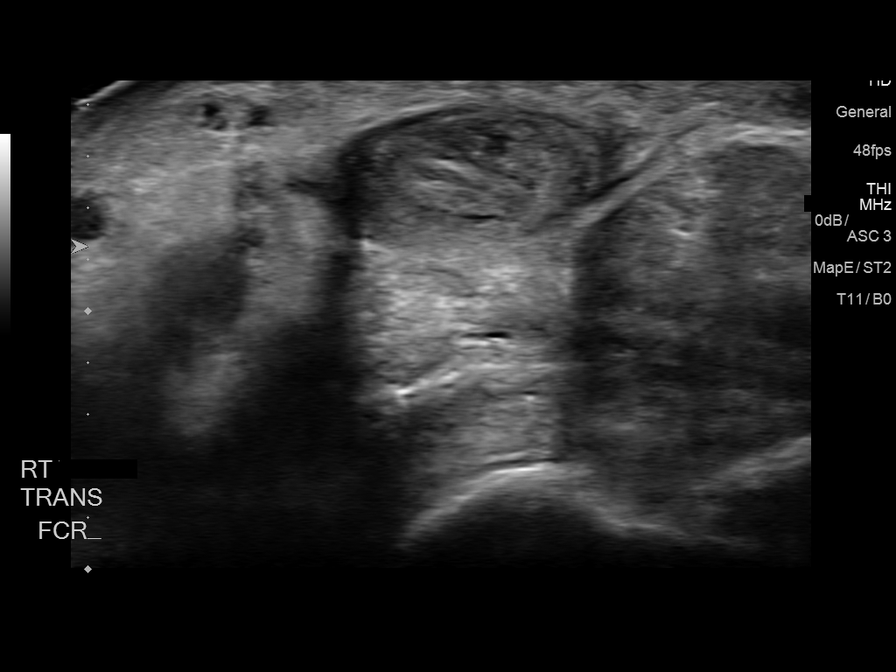
[im 2/8]
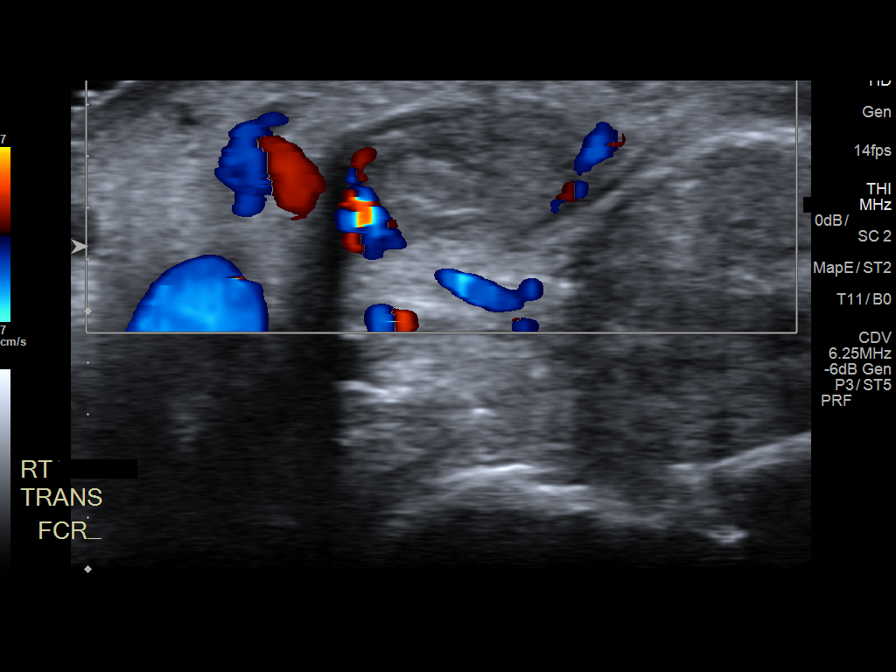
[im 3/8]
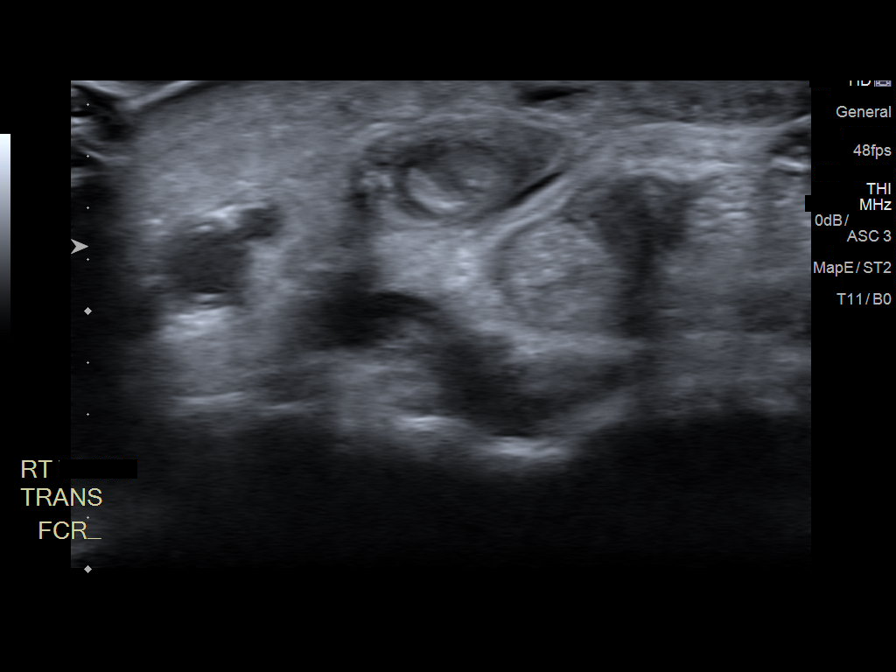
[im 4/8]
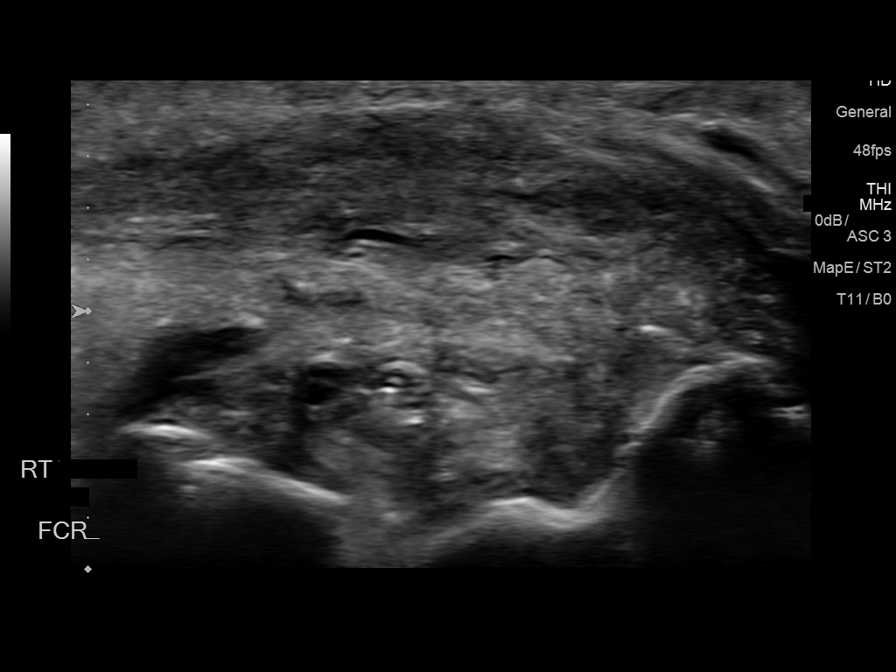
[im 5/8]
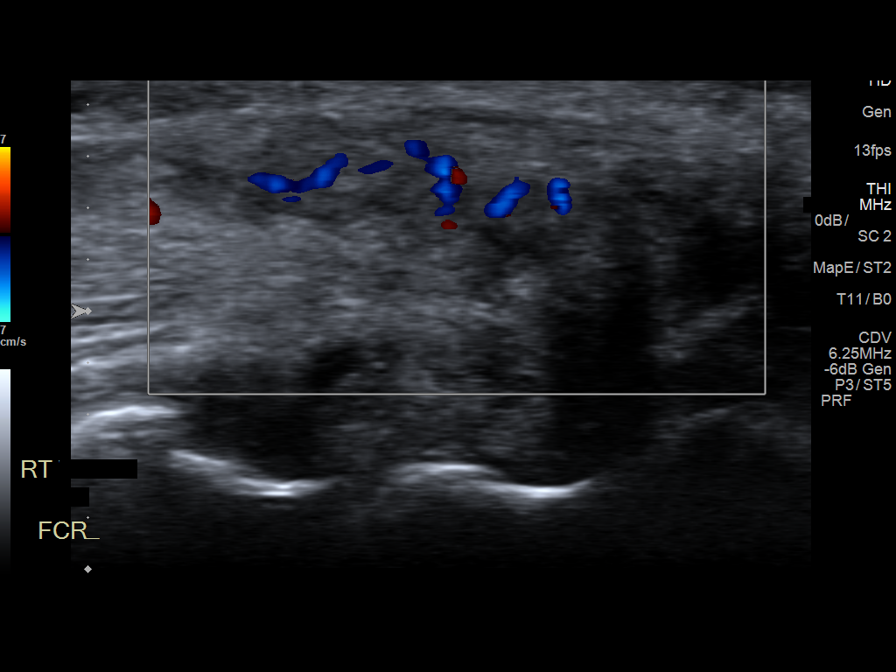
[im 6/8]
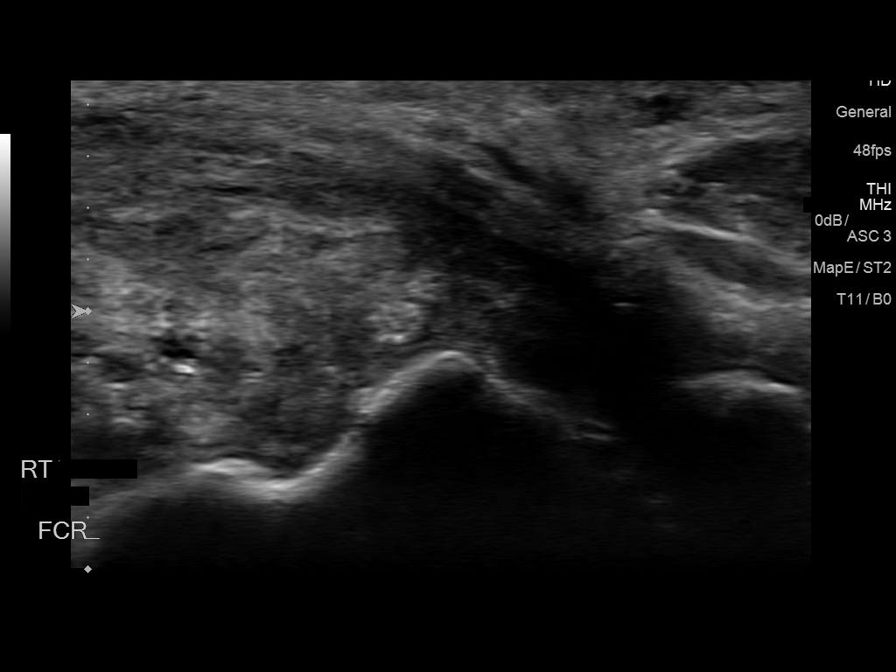
[im 7/8]
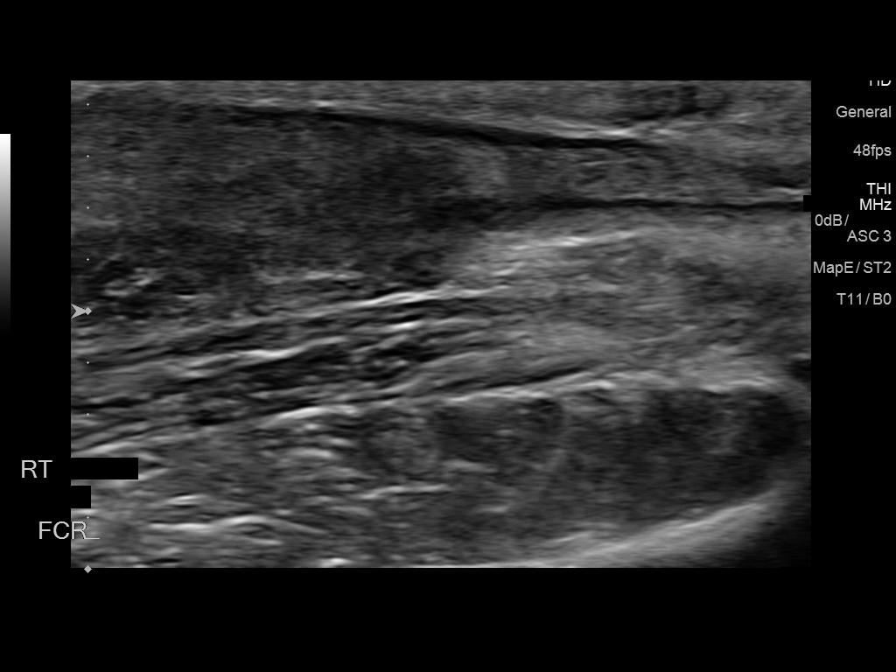
[im 8/8]
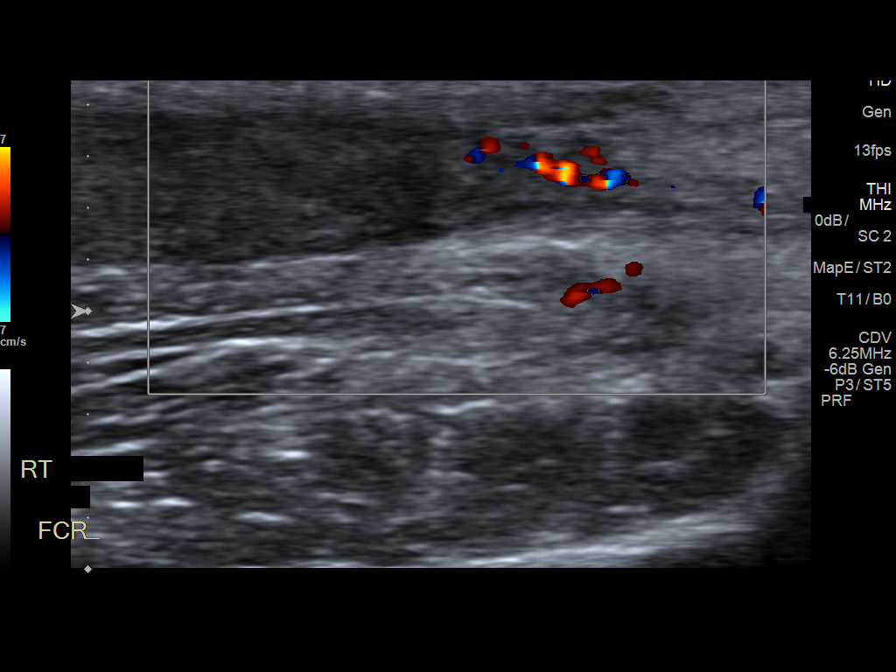

[8 of 8 positions shown; findings below may reference images not displayed]

FINDINGS: At the site of the palpable abnormality along the radial aspect of
the volar wrist, there is marked enlargement of the flexor carpi
radialis tendon with interstitial hypoechoic and anechoic signal,
consistent with tendinosis and interstitial tearing. There is
prominent surrounding hyperemia. No complete tendon tear. Abnormal
fluid within the tendon sheath extends to the myotendinous junction.
IMPRESSION: Palpable abnormality along the radial aspect of the volar wrist
corresponds to severe tendinosis and partial tearing of the flexor
carpi radialis tendon. No complete tendon tear. Inflammation within
the tendon sheath extends to the myotendinous junction.

## 2018-10-21 ENCOUNTER — Ambulatory Visit (INDEPENDENT_AMBULATORY_CARE_PROVIDER_SITE_OTHER): Payer: Medicare Other | Admitting: Pulmonary Disease

## 2018-10-21 ENCOUNTER — Encounter: Payer: Self-pay | Admitting: Pulmonary Disease

## 2018-10-21 VITALS — BP 112/74 | HR 65 | Ht 68.0 in | Wt 190.0 lb

## 2018-10-21 DIAGNOSIS — G4733 Obstructive sleep apnea (adult) (pediatric): Secondary | ICD-10-CM | POA: Diagnosis not present
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
## 2018-10-21 NOTE — Patient Instructions (Signed)
Patient with a history of obstructive sleep apnea, did not tolerate CPAP in the past Concern for sleep disordered breathing at present  We will schedule an in lab polysomnogram  I will see you back in the office in about 3 months Call with significant symptoms  Options of treatment as discussed in the office today Sleep Apnea Sleep apnea is a condition in which breathing pauses or becomes shallow during sleep. Episodes of sleep apnea usually last 10 seconds or longer, and they may occur as many as 20 times an hour. Sleep apnea disrupts your sleep and keeps your body from getting the rest that it needs. This condition can increase your risk of certain health problems, including:  Heart attack.  Stroke.  Obesity.  Diabetes.  Heart failure.  Irregular heartbeat. There are three kinds of sleep apnea:  Obstructive sleep apnea. This kind is caused by a blocked or collapsed airway.  Central sleep apnea. This kind happens when the part of the brain that controls breathing does not send the correct signals to the muscles that control breathing.  Mixed sleep apnea. This is a combination of obstructive and central sleep apnea. What are the causes? The most common cause of this condition is a collapsed or blocked airway. An airway can collapse or become blocked if:  Your throat muscles are abnormally relaxed.  Your tongue and tonsils are larger than normal.  You are overweight.  Your airway is smaller than normal. What increases the risk? This condition is more likely to develop in people who:  Are overweight.  Smoke.  Have a smaller than normal airway.  Are elderly.  Are male.  Drink alcohol.  Take sedatives or tranquilizers.  Have a family history of sleep apnea. What are the signs or symptoms? Symptoms of this condition include:  Trouble staying asleep.  Daytime sleepiness and tiredness.  Irritability.  Loud snoring.  Morning headaches.  Trouble  concentrating.  Forgetfulness.  Decreased interest in sex.  Unexplained sleepiness.  Mood swings.  Personality changes.  Feelings of depression.  Waking up often during the night to urinate.  Dry mouth.  Sore throat. How is this diagnosed? This condition may be diagnosed with:  A medical history.  A physical exam.  A series of tests that are done while you are sleeping (sleep study). These tests are usually done in a sleep lab, but they may also be done at home. How is this treated? Treatment for this condition aims to restore normal breathing and to ease symptoms during sleep. It may involve managing health issues that can affect breathing, such as high blood pressure or obesity. Treatment may include:  Sleeping on your side.  Using a decongestant if you have nasal congestion.  Avoiding the use of depressants, including alcohol, sedatives, and narcotics.  Losing weight if you are overweight.  Making changes to your diet.  Quitting smoking.  Using a device to open your airway while you sleep, such as: ? An oral appliance. This is a custom-made mouthpiece that shifts your lower jaw forward. ? A continuous positive airway pressure (CPAP) device. This device delivers oxygen to your airway through a mask. ? A nasal expiratory positive airway pressure (EPAP) device. This device has valves that you put into each nostril. ? A bi-level positive airway pressure (BPAP) device. This device delivers oxygen to your airway through a mask.  Surgery if other treatments do not work. During surgery, excess tissue is removed to create a wider airway. It is important  to get treatment for sleep apnea. Without treatment, this condition can lead to:  High blood pressure.  Coronary artery disease.  (Men) An inability to achieve or maintain an erection (impotence).  Reduced thinking abilities. Follow these instructions at home:  Make any lifestyle changes that your health care  provider recommends.  Eat a healthy, well-balanced diet.  Take over-the-counter and prescription medicines only as told by your health care provider.  Avoid using depressants, including alcohol, sedatives, and narcotics.  Take steps to lose weight if you are overweight.  If you were given a device to open your airway while you sleep, use it only as told by your health care provider.  Do not use any tobacco products, such as cigarettes, chewing tobacco, and e-cigarettes. If you need help quitting, ask your health care provider.  Keep all follow-up visits as told by your health care provider. This is important. Contact a health care provider if:  The device that you received to open your airway during sleep is uncomfortable or does not seem to be working.  Your symptoms do not improve.  Your symptoms get worse. Get help right away if:  You develop chest pain.  You develop shortness of breath.  You develop discomfort in your back, arms, or stomach.  You have trouble speaking.  You have weakness on one side of your body.  You have drooping in your face. These symptoms may represent a serious problem that is an emergency. Do not wait to see if the symptoms will go away. Get medical help right away. Call your local emergency services (911 in the [place] Do not drive yourself to the hospital. This information is not intended to replace advice given to you by your health care provider. Make sure you discuss any questions you have with your health care provider. Document Released: 08/24/2002 Document Revised: 04/01/2017 Document Reviewed: 06/13/2015 Elsevier Interactive Patient Education  2019 [employer].

## 2018-10-29 ENCOUNTER — Other Ambulatory Visit: Payer: Self-pay

## 2018-10-29 ENCOUNTER — Encounter (HOSPITAL_BASED_OUTPATIENT_CLINIC_OR_DEPARTMENT_OTHER): Payer: Self-pay | Admitting: *Deleted

## 2018-10-29 NOTE — Progress Notes (Signed)
   10/29/18 1555  OBSTRUCTIVE SLEEP APNEA  Have you ever been diagnosed with sleep apnea through a sleep study? Yes  If yes, do you have and use a CPAP or BPAP machine every night? 0  Do you snore loudly (loud enough to be heard through closed doors)?  1  Do you often feel tired, fatigued, or sleepy during the daytime (such as falling asleep during driving or talking to someone)? 1  Has anyone observed you stop breathing during your sleep? 0  Do you have, or are you being treated for high blood pressure? 1  BMI more than 35 kg/m2? 0  Age > 50 (1-yes) 1  Neck circumference greater than:Male 16 inches or larger, Male 17inches or larger? 0  Male Gender (Yes=1) 1  Obstructive Sleep Apnea Score 5  Score 5 or greater  Results sent to PCP

## 2018-10-30 NOTE — Progress Notes (Signed)
Reviewed pt's history and anesthesia records with Dr Sabra Heck. Pt states he has limited ROM in his neck due to hardware. Should be ok for surgery with Dr Fredna Dow 2/20 at [facility]. No consult needed.

## 2018-11-04 ENCOUNTER — Encounter (HOSPITAL_BASED_OUTPATIENT_CLINIC_OR_DEPARTMENT_OTHER)
Admission: RE | Admit: 2018-11-04 | Discharge: 2018-11-04 | Disposition: A | Discharge status: Home / Self Care | Payer: Medicare Other | Source: Ambulatory Visit | Attending: Orthopedic Surgery | Admitting: Orthopedic Surgery

## 2018-11-04 DIAGNOSIS — M8580 Other specified disorders of bone density and structure, unspecified site: Secondary | ICD-10-CM | POA: Diagnosis not present

## 2018-11-04 DIAGNOSIS — Z01812 Encounter for preprocedural laboratory examination: Secondary | ICD-10-CM | POA: Insufficient documentation

## 2018-11-04 DIAGNOSIS — Z85820 Personal history of malignant melanoma of skin: Secondary | ICD-10-CM | POA: Diagnosis not present

## 2018-11-04 DIAGNOSIS — Z800 Family history of malignant neoplasm of digestive organs: Secondary | ICD-10-CM | POA: Diagnosis not present

## 2018-11-04 DIAGNOSIS — M1812 Unilateral primary osteoarthritis of first carpometacarpal joint, left hand: Secondary | ICD-10-CM | POA: Diagnosis not present

## 2018-11-04 DIAGNOSIS — Z981 Arthrodesis status: Secondary | ICD-10-CM | POA: Diagnosis not present

## 2018-11-04 DIAGNOSIS — F329 Major depressive disorder, single episode, unspecified: Secondary | ICD-10-CM | POA: Diagnosis not present

## 2018-11-04 DIAGNOSIS — M48061 Spinal stenosis, lumbar region without neurogenic claudication: Secondary | ICD-10-CM | POA: Diagnosis not present

## 2018-11-04 DIAGNOSIS — E781 Pure hyperglyceridemia: Secondary | ICD-10-CM | POA: Diagnosis not present

## 2018-11-04 DIAGNOSIS — Z87891 Personal history of nicotine dependence: Secondary | ICD-10-CM | POA: Diagnosis not present

## 2018-11-04 DIAGNOSIS — E1151 Type 2 diabetes mellitus with diabetic peripheral angiopathy without gangrene: Secondary | ICD-10-CM | POA: Diagnosis not present

## 2018-11-04 DIAGNOSIS — Z8711 Personal history of peptic ulcer disease: Secondary | ICD-10-CM | POA: Diagnosis not present

## 2018-11-04 DIAGNOSIS — Z833 Family history of diabetes mellitus: Secondary | ICD-10-CM | POA: Diagnosis not present

## 2018-11-04 DIAGNOSIS — G8929 Other chronic pain: Secondary | ICD-10-CM | POA: Diagnosis not present

## 2018-11-04 DIAGNOSIS — M549 Dorsalgia, unspecified: Secondary | ICD-10-CM | POA: Diagnosis not present

## 2018-11-04 DIAGNOSIS — G4700 Insomnia, unspecified: Secondary | ICD-10-CM | POA: Diagnosis not present

## 2018-11-04 DIAGNOSIS — I10 Essential (primary) hypertension: Secondary | ICD-10-CM | POA: Diagnosis not present

## 2018-11-04 DIAGNOSIS — G4733 Obstructive sleep apnea (adult) (pediatric): Secondary | ICD-10-CM | POA: Diagnosis not present

## 2018-11-04 DIAGNOSIS — K219 Gastro-esophageal reflux disease without esophagitis: Secondary | ICD-10-CM | POA: Diagnosis not present

## 2018-11-04 DIAGNOSIS — E785 Hyperlipidemia, unspecified: Secondary | ICD-10-CM | POA: Diagnosis not present

## 2018-11-04 LAB — BASIC METABOLIC PANEL
Anion gap: 8 (ref 5–15)
BUN: 20 mg/dL (ref 8–23)
CO2: 29 mmol/L (ref 22–32)
Calcium: 9.5 mg/dL (ref 8.9–10.3)
Chloride: 104 mmol/L (ref 98–111)
Creatinine, Ser: 1.22 mg/dL (ref 0.61–1.24)
GFR calc Af Amer: >60 mL/min (ref >60)
GFR calc non Af Amer: 58 mL/min — ABNORMAL LOW (ref >60)
Glucose, Bld: 125 mg/dL — ABNORMAL HIGH (ref 70–99)
Potassium: 4.8 mmol/L (ref 3.5–5.1)
Sodium: 141 mmol/L (ref 135–145)

## 2018-11-05 NOTE — Anesthesia Preprocedure Evaluation (Addendum)
Anesthesia Evaluation  Patient identified by MRN, date of birth, ID band Patient awake    Reviewed: Allergy & Precautions, NPO status , Patient's Chart, lab work & pertinent test results  Airway Mallampati: II  TM Distance: >3 FB Neck ROM: Limited    Dental no notable dental hx.    Pulmonary sleep apnea , former smoker,    Pulmonary exam normal breath sounds clear to auscultation       Cardiovascular hypertension, Pt. on medications and Pt. on home beta blockers + Peripheral Vascular Disease  Normal cardiovascular exam Rhythm:Regular Rate:Normal  ECG: rate 59   Neuro/Psych PSYCHIATRIC DISORDERS Depression    GI/Hepatic Neg liver ROS, PUD, GERD  Medicated and Controlled,  Endo/Other  diabetes, Oral Hypoglycemic Agents  Renal/GU negative Renal ROS     Musculoskeletal Spinal stenosis Chronic back pain   Abdominal   Peds  Hematology HLD   Anesthesia Other Findings CARPOMETACARPAL ARTHRITIS LEFT THUMB  Reproductive/Obstetrics                            Anesthesia Physical Anesthesia Plan  ASA: III  Anesthesia Plan: Regional   Post-op Pain Management:    Induction:   PONV Risk Score and Plan: 1 and Ondansetron, Dexamethasone and Treatment may vary due to age or medical condition  Airway Management Planned: Natural Airway  Additional Equipment:   Intra-op Plan:   Post-operative Plan:   Informed Consent: I have reviewed the patients History and Physical, chart, labs and discussed the procedure including the risks, benefits and alternatives for the proposed anesthesia with the patient or authorized representative who has indicated his/her understanding and acceptance.     Dental advisory given  Plan Discussed with: CRNA  Anesthesia Plan Comments:       Anesthesia Quick Evaluation

## 2018-11-06 ENCOUNTER — Encounter (HOSPITAL_BASED_OUTPATIENT_CLINIC_OR_DEPARTMENT_OTHER)
Admission: RE | Disposition: A | Discharge status: Home / Self Care | Payer: Self-pay | Source: Home / Self Care | Attending: Orthopedic Surgery

## 2018-11-06 ENCOUNTER — Other Ambulatory Visit: Payer: Self-pay

## 2018-11-06 ENCOUNTER — Encounter (HOSPITAL_BASED_OUTPATIENT_CLINIC_OR_DEPARTMENT_OTHER): Payer: Self-pay | Admitting: *Deleted

## 2018-11-06 ENCOUNTER — Ambulatory Visit (HOSPITAL_BASED_OUTPATIENT_CLINIC_OR_DEPARTMENT_OTHER): Payer: Medicare Other | Admitting: Anesthesiology

## 2018-11-06 ENCOUNTER — Ambulatory Visit (HOSPITAL_BASED_OUTPATIENT_CLINIC_OR_DEPARTMENT_OTHER)
Admission: RE | Admit: 2018-11-06 | Discharge: 2018-11-06 | Disposition: A | Discharge status: Home / Self Care | Payer: Medicare Other | Attending: Orthopedic Surgery | Admitting: Orthopedic Surgery

## 2018-11-06 DIAGNOSIS — M48061 Spinal stenosis, lumbar region without neurogenic claudication: Secondary | ICD-10-CM | POA: Insufficient documentation

## 2018-11-06 DIAGNOSIS — M549 Dorsalgia, unspecified: Secondary | ICD-10-CM | POA: Diagnosis not present

## 2018-11-06 DIAGNOSIS — G8929 Other chronic pain: Secondary | ICD-10-CM | POA: Insufficient documentation

## 2018-11-06 DIAGNOSIS — G4733 Obstructive sleep apnea (adult) (pediatric): Secondary | ICD-10-CM | POA: Insufficient documentation

## 2018-11-06 DIAGNOSIS — Z833 Family history of diabetes mellitus: Secondary | ICD-10-CM | POA: Insufficient documentation

## 2018-11-06 DIAGNOSIS — E785 Hyperlipidemia, unspecified: Secondary | ICD-10-CM | POA: Insufficient documentation

## 2018-11-06 DIAGNOSIS — E781 Pure hyperglyceridemia: Secondary | ICD-10-CM | POA: Insufficient documentation

## 2018-11-06 DIAGNOSIS — M1812 Unilateral primary osteoarthritis of first carpometacarpal joint, left hand: Secondary | ICD-10-CM | POA: Insufficient documentation

## 2018-11-06 DIAGNOSIS — Z8711 Personal history of peptic ulcer disease: Secondary | ICD-10-CM | POA: Insufficient documentation

## 2018-11-06 DIAGNOSIS — G4700 Insomnia, unspecified: Secondary | ICD-10-CM | POA: Insufficient documentation

## 2018-11-06 DIAGNOSIS — Z981 Arthrodesis status: Secondary | ICD-10-CM | POA: Insufficient documentation

## 2018-11-06 DIAGNOSIS — E1151 Type 2 diabetes mellitus with diabetic peripheral angiopathy without gangrene: Secondary | ICD-10-CM | POA: Diagnosis not present

## 2018-11-06 DIAGNOSIS — M8580 Other specified disorders of bone density and structure, unspecified site: Secondary | ICD-10-CM | POA: Insufficient documentation

## 2018-11-06 DIAGNOSIS — Z85820 Personal history of malignant melanoma of skin: Secondary | ICD-10-CM | POA: Insufficient documentation

## 2018-11-06 DIAGNOSIS — K219 Gastro-esophageal reflux disease without esophagitis: Secondary | ICD-10-CM | POA: Insufficient documentation

## 2018-11-06 DIAGNOSIS — Z87891 Personal history of nicotine dependence: Secondary | ICD-10-CM | POA: Insufficient documentation

## 2018-11-06 DIAGNOSIS — F329 Major depressive disorder, single episode, unspecified: Secondary | ICD-10-CM | POA: Insufficient documentation

## 2018-11-06 DIAGNOSIS — Z800 Family history of malignant neoplasm of digestive organs: Secondary | ICD-10-CM | POA: Insufficient documentation

## 2018-11-06 DIAGNOSIS — I10 Essential (primary) hypertension: Secondary | ICD-10-CM | POA: Insufficient documentation

## 2018-11-06 HISTORY — PX: TENDON TRANSFER: SHX6109

## 2018-11-06 HISTORY — DX: Other complications of anesthesia, initial encounter: T88.59XA

## 2018-11-06 HISTORY — PX: CARPOMETACARPEL SUSPENSION PLASTY: SHX5005

## 2018-11-06 HISTORY — DX: Dorsalgia, unspecified: M54.9

## 2018-11-06 HISTORY — DX: Other chronic pain: G89.29

## 2018-11-06 HISTORY — DX: Adverse effect of unspecified anesthetic, initial encounter: T41.45XA

## 2018-11-06 LAB — GLUCOSE, CAPILLARY
Glucose-Capillary: 122 mg/dL — ABNORMAL HIGH (ref 70–99)
Glucose-Capillary: 100 mg/dL — ABNORMAL HIGH (ref 70–99)

## 2018-11-06 SURGERY — CARPOMETACARPEL (CMC) SUSPENSION PLASTY
Anesthesia: Regional | Site: Thumb | Laterality: Left

## 2018-11-06 MED ORDER — KETOROLAC TROMETHAMINE 30 MG/ML IJ SOLN
INTRAMUSCULAR | Status: AC
  Filled 2018-11-06: qty 1

## 2018-11-06 MED ORDER — FENTANYL CITRATE (PF) 100 MCG/2ML IJ SOLN
INTRAMUSCULAR | Status: AC
  Filled 2018-11-06: qty 2

## 2018-11-06 MED ORDER — CEFAZOLIN SODIUM-DEXTROSE 2-4 GM/100ML-% IV SOLN
INTRAVENOUS | Status: AC
  Filled 2018-11-06: qty 100

## 2018-11-06 MED ORDER — ACETAMINOPHEN 500 MG PO TABS
ORAL_TABLET | ORAL | Status: AC
  Filled 2018-11-06: qty 2

## 2018-11-06 MED ORDER — ONDANSETRON HCL 4 MG/2ML IJ SOLN: 4.0000 mg | Freq: Once | INTRAMUSCULAR | Status: DC | PRN

## 2018-11-06 MED ORDER — OXYCODONE-ACETAMINOPHEN 5-325 MG PO TABS: 1.0000 | ORAL_TABLET | ORAL | 0 refills | Status: AC | PRN

## 2018-11-06 MED ORDER — CLONIDINE HCL (ANALGESIA) 100 MCG/ML EP SOLN
EPIDURAL | Status: DC | PRN
  Administered 2018-11-06: 100 ug

## 2018-11-06 MED ORDER — FENTANYL CITRATE (PF) 100 MCG/2ML IJ SOLN
50.0000 ug | INTRAMUSCULAR | Status: DC | PRN
  Administered 2018-11-06 (×2): 50 ug via INTRAVENOUS

## 2018-11-06 MED ORDER — CHLORHEXIDINE GLUCONATE 4 % EX LIQD: 60.0000 mL | Freq: Once | CUTANEOUS | Status: DC

## 2018-11-06 MED ORDER — KETOROLAC TROMETHAMINE 30 MG/ML IJ SOLN
INTRAMUSCULAR | Status: DC | PRN
  Administered 2018-11-06: 30 mg via INTRAVENOUS

## 2018-11-06 MED ORDER — BUPIVACAINE HCL (PF) 0.25 % IJ SOLN
INTRAMUSCULAR | Status: DC | PRN
  Administered 2018-11-06: 6 mL

## 2018-11-06 MED ORDER — LACTATED RINGERS IV SOLN
INTRAVENOUS | Status: DC
  Administered 2018-11-06 (×2): via INTRAVENOUS

## 2018-11-06 MED ORDER — MEPIVACAINE HCL 1.5 % IJ SOLN
INTRAMUSCULAR | Status: DC | PRN
  Administered 2018-11-06: 20 mL via PERINEURAL

## 2018-11-06 MED ORDER — PROPOFOL 500 MG/50ML IV EMUL
INTRAVENOUS | Status: DC | PRN
  Administered 2018-11-06: 50 ug/kg/min via INTRAVENOUS

## 2018-11-06 MED ORDER — LIDOCAINE HCL (PF) 1 % IJ SOLN
INTRAMUSCULAR | Status: AC
  Filled 2018-11-06: qty 10

## 2018-11-06 MED ORDER — MIDAZOLAM HCL 2 MG/2ML IJ SOLN
INTRAMUSCULAR | Status: AC
  Filled 2018-11-06: qty 2

## 2018-11-06 MED ORDER — ONDANSETRON HCL 4 MG/2ML IJ SOLN
INTRAMUSCULAR | Status: DC | PRN
  Administered 2018-11-06: 4 mg via INTRAVENOUS

## 2018-11-06 MED ORDER — LIDOCAINE HCL (PF) 1 % IJ SOLN
INTRAMUSCULAR | Status: AC
  Filled 2018-11-06: qty 60

## 2018-11-06 MED ORDER — MIDAZOLAM HCL 2 MG/2ML IJ SOLN
1.0000 mg | INTRAMUSCULAR | Status: DC | PRN
  Administered 2018-11-06: 1 mg via INTRAVENOUS

## 2018-11-06 MED ORDER — DEXAMETHASONE SODIUM PHOSPHATE 10 MG/ML IJ SOLN
INTRAMUSCULAR | Status: AC
  Filled 2018-11-06: qty 1

## 2018-11-06 MED ORDER — SCOPOLAMINE 1 MG/3DAYS TD PT72: 1.0000 | MEDICATED_PATCH | Freq: Once | TRANSDERMAL | Status: DC | PRN

## 2018-11-06 MED ORDER — CEFAZOLIN SODIUM-DEXTROSE 2-4 GM/100ML-% IV SOLN: 2.0000 g | INTRAVENOUS | Status: DC

## 2018-11-06 MED ORDER — DEXAMETHASONE SODIUM PHOSPHATE 10 MG/ML IJ SOLN
INTRAMUSCULAR | Status: DC | PRN
  Administered 2018-11-06: 10 mg via INTRAVENOUS

## 2018-11-06 MED ORDER — BUPIVACAINE HCL (PF) 0.25 % IJ SOLN
INTRAMUSCULAR | Status: AC
  Filled 2018-11-06: qty 120

## 2018-11-06 MED ORDER — ACETAMINOPHEN 500 MG PO TABS
1000.0000 mg | ORAL_TABLET | Freq: Once | ORAL | Status: AC
  Administered 2018-11-06: 1000 mg via ORAL

## 2018-11-06 MED ORDER — FENTANYL CITRATE (PF) 100 MCG/2ML IJ SOLN
25.0000 ug | INTRAMUSCULAR | Status: DC | PRN
  Administered 2018-11-06: 50 ug via INTRAVENOUS

## 2018-11-06 SURGICAL SUPPLY — 79 items
BAG DECANTER FOR FLEXI CONT (MISCELLANEOUS) IMPLANT
BALL CTTN LRG ABS STRL LF (GAUZE/BANDAGES/DRESSINGS)
BIT DRILL PASSING CMC 1/4 FLEX (BIT) ×1 IMPLANT
BLADE MINI RND TIP GREEN BEAV (BLADE) ×3 IMPLANT
BLADE SURG 15 STRL LF DISP TIS (BLADE) ×1 IMPLANT
BLADE SURG 15 STRL SS (BLADE) ×3
BNDG COHESIVE 3X5 TAN STRL LF (GAUZE/BANDAGES/DRESSINGS) ×3 IMPLANT
BNDG ESMARK 4X9 LF (GAUZE/BANDAGES/DRESSINGS) ×3 IMPLANT
BNDG GAUZE ELAST 4 BULKY (GAUZE/BANDAGES/DRESSINGS) ×3 IMPLANT
BUTTON ALL-SUT W/BACKSTOP (Orthopedic Implant) ×3 IMPLANT
CHLORAPREP W/TINT 26ML (MISCELLANEOUS) ×3 IMPLANT
CORD BIPOLAR FORCEPS 12FT (ELECTRODE) ×3 IMPLANT
COTTONBALL LRG STERILE PKG (GAUZE/BANDAGES/DRESSINGS) IMPLANT
COVER BACK TABLE 60X90IN (DRAPES) ×3 IMPLANT
COVER MAYO STAND STRL (DRAPES) ×3 IMPLANT
COVER WAND RF STERILE (DRAPES) IMPLANT
CUFF TOURNIQUET SINGLE 18IN (TOURNIQUET CUFF) ×3 IMPLANT
DECANTER SPIKE VIAL GLASS SM (MISCELLANEOUS) IMPLANT
DRAIN TLS ROUND 10FR (DRAIN) IMPLANT
DRAPE EXTREMITY T 121X128X90 (DISPOSABLE) ×3 IMPLANT
DRAPE OEC MINIVIEW 54X84 (DRAPES) ×3 IMPLANT
DRAPE SURG 17X23 STRL (DRAPES) ×3 IMPLANT
DRILL PASSING CMC 1/4 FLEX (BIT) ×3
DRSG PAD ABDOMINAL 8X10 ST (GAUZE/BANDAGES/DRESSINGS) ×3 IMPLANT
GAUZE 4X4 16PLY RFD (DISPOSABLE) IMPLANT
GAUZE SPONGE 4X4 12PLY STRL (GAUZE/BANDAGES/DRESSINGS) ×3 IMPLANT
GAUZE XEROFORM 1X8 LF (GAUZE/BANDAGES/DRESSINGS) ×3 IMPLANT
GLOVE BIO SURGEON STRL SZ7.5 (GLOVE) ×3 IMPLANT
GLOVE BIOGEL PI IND STRL 8 (GLOVE) ×2 IMPLANT
GLOVE BIOGEL PI IND STRL 8.5 (GLOVE) ×1 IMPLANT
GLOVE BIOGEL PI INDICATOR 8 (GLOVE) ×4
GLOVE BIOGEL PI INDICATOR 8.5 (GLOVE) ×2
GLOVE SURG ORTHO 8.0 STRL STRW (GLOVE) ×3 IMPLANT
GOWN STRL REUS W/ TWL LRG LVL3 (GOWN DISPOSABLE) IMPLANT
GOWN STRL REUS W/TWL LRG LVL3 (GOWN DISPOSABLE)
GOWN STRL REUS W/TWL XL LVL3 (GOWN DISPOSABLE) ×6 IMPLANT
K-WIRE .035X4 (WIRE) IMPLANT
LOOP VESSEL MAXI BLUE (MISCELLANEOUS) IMPLANT
NEEDLE HYPO 22GX1.5 SAFETY (NEEDLE) ×3 IMPLANT
NEEDLE KEITH (NEEDLE) IMPLANT
NEEDLE PRECISIONGLIDE 27X1.5 (NEEDLE) IMPLANT
NS IRRIG 1000ML POUR BTL (IV SOLUTION) ×3 IMPLANT
PACK BASIN DAY SURGERY FS (CUSTOM PROCEDURE TRAY) ×3 IMPLANT
PAD CAST 3X4 CTTN HI CHSV (CAST SUPPLIES) ×1 IMPLANT
PADDING CAST ABS 3INX4YD NS (CAST SUPPLIES)
PADDING CAST ABS 4INX4YD NS (CAST SUPPLIES) ×2
PADDING CAST ABS COTTON 3X4 (CAST SUPPLIES) IMPLANT
PADDING CAST ABS COTTON 4X4 ST (CAST SUPPLIES) ×1 IMPLANT
PADDING CAST COTTON 3X4 STRL (CAST SUPPLIES) ×3
SLEEVE SCD COMPRESS KNEE MED (MISCELLANEOUS) ×3 IMPLANT
SPLINT PLASTER CAST XFAST 3X15 (CAST SUPPLIES) IMPLANT
SPLINT PLASTER XTRA FASTSET 3X (CAST SUPPLIES)
STOCKINETTE 4X48 STRL (DRAPES) ×3 IMPLANT
SUT CHROMIC 5 0 P 3 (SUTURE) IMPLANT
SUT ETHIBOND 3-0 V-5 (SUTURE) ×3 IMPLANT
SUT ETHILON 4 0 PS 2 18 (SUTURE) ×3 IMPLANT
SUT FIBERWIRE 2-0 18 17.9 3/8 (SUTURE)
SUT FIBERWIRE 4-0 18 DIAM BLUE (SUTURE)
SUT FIBERWIRE 4-0 18 TAPR NDL (SUTURE)
SUT MERSILENE 2.0 SH NDLE (SUTURE) IMPLANT
SUT MERSILENE 4 0 P 3 (SUTURE) IMPLANT
SUT PROLENE 2 0 SH DA (SUTURE) IMPLANT
SUT SILK 2 0 PERMA HAND 18 BK (SUTURE) IMPLANT
SUT SILK 4 0 PS 2 (SUTURE) IMPLANT
SUT STEEL 3 0 (SUTURE) ×3 IMPLANT
SUT VIC AB 3-0 PS1 18 (SUTURE)
SUT VIC AB 3-0 PS1 18XBRD (SUTURE) IMPLANT
SUT VIC AB 4-0 P-3 18XBRD (SUTURE) IMPLANT
SUT VIC AB 4-0 P2 18 (SUTURE) IMPLANT
SUT VIC AB 4-0 P3 18 (SUTURE)
SUT VICRYL 4-0 PS2 18IN ABS (SUTURE) IMPLANT
SUTURE FIBERWR 2-0 18 17.9 3/8 (SUTURE) IMPLANT
SUTURE FIBERWR 4-0 18 DIA BLUE (SUTURE) IMPLANT
SUTURE FIBERWR 4-0 18 TAPR NDL (SUTURE) IMPLANT
SYR BULB 3OZ (MISCELLANEOUS) ×3 IMPLANT
SYR CONTROL 10ML LL (SYRINGE) ×3 IMPLANT
TOWEL GREEN STERILE FF (TOWEL DISPOSABLE) ×6 IMPLANT
TUBE FEEDING ENTERAL 5FR 16IN (TUBING) IMPLANT
UNDERPAD 30X30 (UNDERPADS AND DIAPERS) ×3 IMPLANT

## 2018-11-06 NOTE — Op Note (Signed)
I assisted Surgeon(s) and Role:    Daryll Brod, MD - Primary on the Procedure(s): SUSPENSION PLASTY LEFT THUMB EXCISION TRAPEZIUM TRANSFER TENDON TENDON TRANSFER on 11/06/2018.  I provided assistance on this case as follows: retraction soft tissues, passage of microlink suture, tying of suture, closure wound.  Electronically signed by: Leanora Cover, MD Date: 11/06/2018 Time: 5:14 PM

## 2018-11-06 NOTE — Transfer of Care (Signed)
Immediate Anesthesia Transfer of Care Note  Patient: [name]  Procedure(s) Performed: SUSPENSION PLASTY LEFT THUMB EXCISION TRAPEZIUM TRANSFER TENDON (Left ) TENDON TRANSFER (Left )  Patient Location: PACU  Anesthesia Type:MAC and Regional  Level of Consciousness: awake, alert  and oriented  Airway & Oxygen Therapy: Patient Spontanous Breathing and Patient connected to face mask oxygen  Post-op Assessment: Report given to RN and Post -op Vital signs reviewed and stable  Post vital signs: Reviewed and stable  Last Vitals:  Vitals Value Taken Time  BP    Temp    Pulse 61 11/06/2018 10:10 AM  Resp 11 11/06/2018 10:10 AM  SpO2 99 % 11/06/2018 10:10 AM  Vitals shown include unvalidated device data.  Last Pain:  Vitals:   11/06/18 0818  TempSrc:   PainSc: 2       Patients Stated Pain Goal: 3 (76/14/70 9295)  Complications: No apparent anesthesia complications

## 2018-11-06 NOTE — Op Note (Signed)
NAME: [name] MEDICAL RECORD NO: [id number] DATE OF BIRTH: 02/23/1946 FACILITY: [facility] LOCATION: [facility] PHYSICIAN: Wynonia Sours, MD   OPERATIVE REPORT   DATE OF PROCEDURE: 11/06/18    PREOPERATIVE DIAGNOSIS:  C MC arthritis left thumb   POSTOPERATIVE DIAGNOSIS:   Same   PROCEDURE:   With trapezium excision with FCR transfer and micro link  suspension plasty of thumb   SURGEON: Daryll Brod, M.D.   ASSISTANT: Leanora Cover, MD   ANESTHESIA:  Regional with sedation and Local   INTRAVENOUS FLUIDS:  Per anesthesia flow sheet.   ESTIMATED BLOOD LOSS:  Minimal.   COMPLICATIONS:  None.   SPECIMENS:  none   TOURNIQUET TIME:   * Missing tourniquet times found for documented tourniquets in log: [id number] *   DISPOSITION:  Stable to PACU.   INDICATIONS: Patient is a 73 year old male with a long history of pain at the basilar joint of his left thumb this not responded to conservative treatment.  He has elected to undergo surgical reconstruction with arthroplasty of the left thumb.  He is selected suspension plasty with trapezial excision to FCR transfer and a micro-length suspension left thumb.  Pre-peri-and postoperative course been discussed along with risk complications.  He is aware there is no guarantee to the surgery the possibility of infection recurrence injury to arteries nerves tendons complete relief symptoms dystrophy.  The preoperative area the patient is seen the extremity marked by both patient and surgeon antibiotic given clavicular block was carried out without difficulty in the preoperative area.  OPERATIVE COURSE: Brought to the operating room placed in the supine position prepped and draped with ChloraPrep.  A three-minute dry time was allowed timeout taken to confirm patient procedure.  The limb was exsanguinated with an Esmarch bandage turn placed high in the arm was inflated to 250 mmHg.  A straight incision was made over the base of the thumb  between the extensor brevis abductor longus tendons.  This carried down through subcutaneous tissue.  Bleeders were electrocauterized with bipolar.  The radial sensory nerves were identified protected.  Retractors were placed after making incision the periosteum of the trapezium trapezium.  This was traced distally to the [facility] joint proximally identifying the radial artery which was retracted for protection the STT joint was opened.  Blunt sharp dissection the periosteum was elevated from the trapezium along with the capsule from the STT and [facility] joint.  The trapezium was isolated this was removed piecemeal using a large rondure.  Taken to remove as much of the bone as possible especially in the area between the first and second metacarpals.  The flexor carpi radialis was intact at the base of the wound.  On completion of the removal of the trapezium the flexor carpi radialis was then brought into the wound with tract retractors flexion and radial deviation of the wrist allowed a one half of the FCR to be harvested by transected proximally with maintaining it distally to its insertion and the base of the index metacarpal.  This was stabilized.  The base of the thumb metacarpal was then isolated at the level of the abductor pollicis longus insertion.  A tight rope guide was then inserted across the base of the thumb base of the index finger and held through the dorsal ulnar aspect of the base of the index finger metacarpal.  An incision was made over the area.  This was dissected down to bone.  A micro link was then passed  with through the to bones a right angle hemostat was placed between the 2 bones to maintain a space and micro link was sutured tight this was done after confirming the position of the guidepin in AP lateral and oblique x-rays.  The micro link was tightened.  The flexor carpi radialis was then woven through the insertion of the abductor pollicis longus and stabilized in position with mattress 3-0  Ethibond sutures.  The wounds were copiously irrigated with saline.  Skin was closed with interrupted 4-0 nylon sutures a sterile compressive dressing and splint was applied prior to application of the dressing and splint a further x-ray was taken to confirm adequate suspension the thumb was placed under compression and no proximal migration was noted.  At the beginning of the case a local infiltration was given quarter percent bupivacaine without epinephrine in each of the potential incision areas.  On deflation of the tourniquet all fingers pinked and he was taken to the recovery room for observation in satisfactory condition.  He will be discharged home return to [facility] [place] in 1 week on Percocet.   Daryll Brod, MD Electronically signed, 11/06/18

## 2018-11-06 NOTE — Brief Op Note (Signed)
11/06/2018  10:05 AM  PATIENT:  Ross Ludwig  73 y.o. male  PRE-OPERATIVE DIAGNOSIS:  CARPOMETACARPAL ARTHRITIS LEFT THUMB  POST-OPERATIVE DIAGNOSIS:  CARPOMETACARPAL ARTHRITIS LEFT THUMB  PROCEDURE:  Procedure(s) with comments: SUSPENSION PLASTY LEFT THUMB EXCISION TRAPEZIUM TRANSFER TENDON (Left) - AX BLO;CK TENDON TRANSFER (Left)  SURGEON:  Surgeon(s) and Role:    Daryll Brod, MD - Primary  PHYSICIAN ASSISTANT:   ASSISTANTS: K Travas Schexnayder,MD   ANESTHESIA:   local, regional and IV sedation  EBL: 40ml  BLOOD ADMINISTERED:none  DRAINS: none   LOCAL MEDICATIONS USED:  BUPIVICAINE   SPECIMEN:  No Specimen  DISPOSITION OF SPECIMEN:  N/A  COUNTS:  YES  TOURNIQUET:  * Missing tourniquet times found for documented tourniquets in log: [id number] *  DICTATION: .Dragon Dictation  PLAN OF CARE: Discharge to home after PACU  PATIENT DISPOSITION:  PACU - hemodynamically stable.

## 2018-11-06 NOTE — Op Note (Signed)
Radiology note.  Under image intensification placement of the guidepin for the micro link was evaluated with AP lateral oblique x-rays confirming its position.  On completion of the placement of the micro length and suturing it.  The position of the thumb was reevaluated with AP lateral oblique image intensification images.  The adequate suspension of the thumb from the index metacarpal was noted.

## 2018-11-06 NOTE — Progress Notes (Signed)
Assisted Dr. Ellender with left, ultrasound guided, supraclavicular block. Side rails up, monitors on throughout procedure. See vital signs in flow sheet. Tolerated Procedure well. 

## 2018-11-06 NOTE — Anesthesia Postprocedure Evaluation (Signed)
Anesthesia Post Note  Patient: [name]  Procedure(s) Performed: SUSPENSION PLASTY LEFT THUMB EXCISION TRAPEZIUM TRANSFER TENDON (Left Thumb) TENDON TRANSFER (Left Thumb)     Patient location during evaluation: PACU Anesthesia Type: Regional Level of consciousness: awake and alert Pain management: pain level controlled Vital Signs Assessment: post-procedure vital signs reviewed and stable Respiratory status: spontaneous breathing, nonlabored ventilation, respiratory function stable and patient connected to nasal cannula oxygen Cardiovascular status: stable and blood pressure returned to baseline Postop Assessment: no apparent nausea or vomiting Anesthetic complications: no    Last Vitals:  Vitals:   11/06/18 1104 11/06/18 1126  BP: (!) 148/87 (!) 148/87  Pulse: 65   Resp: 12 18  Temp: 36.4 C 36.4 C  SpO2: 94% 95%    Last Pain:  Vitals:   11/06/18 1126  TempSrc:   PainSc: 2                  Ryan P Ellender

## 2018-11-06 NOTE — Discharge Instructions (Addendum)
Post Anesthesia Home Care Instructions  Activity: Get plenty of rest for the remainder of the day. A responsible individual must stay with you for 24 hours following the procedure.  For the next 24 hours, DO NOT: -Drive a car -Operate machinery -Drink alcoholic beverages -Take any medication unless instructed by your physician -Make any legal decisions or sign important papers.  Meals: Start with liquid foods such as gelatin or soup. Progress to regular foods as tolerated. Avoid greasy, spicy, heavy foods. If nausea and/or vomiting occur, drink only clear liquids until the nausea and/or vomiting subsides. Call your physician if vomiting continues.  Special Instructions/Symptoms: Your throat may feel dry or sore from the anesthesia or the breathing tube placed in your throat during surgery. If this causes discomfort, gargle with warm salt water. The discomfort should disappear within 24 hours.  If you had a scopolamine patch placed behind your ear for the management of post- operative nausea and/or vomiting:  1. The medication in the patch is effective for 72 hours, after which it should be removed.  Wrap patch in a tissue and discard in the trash. Wash hands thoroughly with soap and water. 2. You may remove the patch earlier than 72 hours if you experience unpleasant side effects which may include dry mouth, dizziness or visual disturbances. 3. Avoid touching the patch. Wash your hands with soap and water after contact with the patch.      Regional Anesthesia Blocks  1. Numbness or the inability to move the "blocked" extremity may last from 3-48 hours after placement. The length of time depends on the medication injected and your individual response to the medication. If the numbness is not going away after 48 hours, call your surgeon.  2. The extremity that is blocked will need to be protected until the numbness is gone and the  Strength has returned. Because you cannot feel it, you  will need to take extra care to avoid injury. Because it may be weak, you may have difficulty moving it or using it. You may not know what position it is in without looking at it while the block is in effect.  3. For blocks in the legs and feet, returning to weight bearing and walking needs to be done carefully. You will need to wait until the numbness is entirely gone and the strength has returned. You should be able to move your leg and foot normally before you try and bear weight or walk. You will need someone to be with you when you first try to ensure you do not fall and possibly risk injury.  4. Bruising and tenderness at the needle site are common side effects and will resolve in a few days.  5. Persistent numbness or new problems with movement should be communicated to the surgeon or the [facility] ([phone])/ [facility] ([phone]).    [facility] Instructions Hand Surgery  Wound Care: Keep your hand elevated above the level of your heart.  Do not allow it to dangle by your side.  Keep the dressing dry and do not remove it unless your doctor advises you to do so.  He will usually change it at the time of your post-op visit.  Moving your fingers is advised to stimulate circulation but will depend on the site of your surgery.  If you have a splint applied, your doctor will advise you regarding movement.  Activity: Do not drive or operate machinery today.  Rest today and   then you may return to your normal activity and work as indicated by your physician.  Diet:  Drink liquids today or eat a light diet.  You may resume a regular diet tomorrow.    General expectations: Pain for two to three days. Fingers may become slightly swollen.  Call your doctor if any of the following occur: Severe pain not relieved by pain medication. Elevated temperature. Dressing soaked with blood. Inability to move fingers. White or bluish color to fingers.  

## 2018-11-06 NOTE — H&P (Signed)
[name] is an 73 y.o. male.   Chief Complaint: pain left thumbHPI:[name] is a 73yo male with left thumb CMC arthritis.  It has been present for at least 16  months. He was seen in October and given an injection which gave him only temporary relief.  He states that his pain is back to moderate to severe in nature with gripping and pinching. He localizes at the base of his thumb he complains of swelling across the top of his hand at the present time. He has had carpal tunnel releases done in the past. He has a history of diabetes arthritis no history of thyroid problems or gout. Family history is positive for diabetes negative for the remainder.   Past Medical History:  Diagnosis Date  . Chronic back pain   . Complication of anesthesia    difficulty getting breathing tube in due to plates in neck and back  . Diabetes mellitus without complication ([place])   . Diverticulitis   . GERD (gastroesophageal reflux disease)   . Hypertension   . Hypertriglyceridemia   . Insomnia, transient   . Major depression   . Melanoma ([place])    R Arm, s/p excision 11/2009  . Osteopenia   . Peptic ulcer disease   . PVD (peripheral vascular disease) ([place])   . Sleep apnea, obstructive    does not use CPAP  . Spinal stenosis     Past Surgical History:  Procedure Laterality Date  . APPENDECTOMY    . CARPAL TUNNEL RELEASE    . CERVICAL FUSION    . ELBOW SURGERY    . HERNIA REPAIR    . lumbar spinal stenosis correction    . SHOULDER SURGERY    . TENOLYSIS Right 12/20/2017   Procedure: DEBRIDEMENT RELEASE FLEXOR CARPI RADIALIS SHEATH RIGHT WRIST;  Surgeon: Daryll Brod, MD;  Location: [place];  Service: Orthopedics;  Laterality: Right;  UPPER ARM    Family History  Problem Relation Age of Onset  . Throat cancer Father    Social History:  reports that he has quit smoking. He has never used smokeless tobacco. He reports that he does not drink alcohol or use drugs.  Allergies:   Allergies  Allergen Reactions  . Feldene [Piroxicam] Swelling    No medications prior to admission.    Results for orders placed or performed during the hospital encounter of 11/06/18 (from the past 48 hour(s))  Basic metabolic panel     Status: Abnormal   Collection Time: 11/04/18  9:38 AM  Result Value Ref Range   Sodium 141 135 - 145 mmol/L   Potassium 4.8 3.5 - 5.1 mmol/L   Chloride 104 98 - 111 mmol/L   CO2 29 22 - 32 mmol/L   Glucose, Bld 125 (H) 70 - 99 mg/dL   BUN 20 8 - 23 mg/dL   Creatinine, Ser 1.22 0.61 - 1.24 mg/dL   Calcium 9.5 8.9 - 10.3 mg/dL   GFR calc non Af Amer 58 (L) >60 mL/min   GFR calc Af Amer >60 >60 mL/min   Anion gap 8 5 - 15    Comment: Performed at [facility], [place] [address] [place], [place] [address]    No results found.   Pertinent items are noted in HPI.  Height 5\' 9"  (1.753 m), weight 83.5 kg.  General appearance: alert, cooperative and appears stated age Head: Normocephalic, without obvious abnormality Neck: no JVD Resp: clear to auscultation bilaterally Cardio: regular  rate and rhythm, S1, S2 normal, no murmur, click, rub or gallop GI: soft, non-tender; bowel sounds normal; no masses,  no organomegaly Extremities: pain left thumb Pulses: 2+ and symmetric Skin: Skin color, texture, turgor normal. No rashes or lesions Neurologic: Grossly normal Incision/Wound: na  Assessment/Plan Assessment:  1. Primary osteoarthritis of first carpometacarpal joint of left hand    Plan: Have discussed with him further injections. He is getting tired of it today these are temporary in nature. We have discussed possibility of surgical intervention with excision of the trapezium micro link tight rope suspension with tendon transfers or suspension. Pre-peri-and postoperative course are discussed along with risk complications. He is aware there is no guarantee to the surgery the possibility of infection recurrence injury to arteries nerves  tendons complete relief symptoms dystrophy. He would like to proceed he is scheduled for suspension plasty tight rope support left thumb as an outpatient under regional anesthesia.   Daryll Brod 11/06/2018, 5:30 AM

## 2018-11-06 NOTE — Anesthesia Procedure Notes (Signed)
Anesthesia Regional Block: Supraclavicular block   Pre-Anesthetic Checklist: ,, timeout performed, Correct Patient, Correct Site, Correct Laterality, Correct Procedure, Correct Position, site marked, Risks and benefits discussed,  Surgical consent,  Pre-op evaluation,  At surgeon's request and post-op pain management  Laterality: Left  Prep: chloraprep       Needles:  Injection technique: Single-shot  Needle Type: Echogenic Stimulator Needle     Needle Length: 9cm  Needle Gauge: 21     Additional Needles:   Procedures:,,,, ultrasound used (permanent image in chart),,,,  Narrative:  Start time: 11/06/2018 8:10 AM End time: 11/06/2018 8:20 AM Injection made incrementally with aspirations every 5 mL.  Performed by: Personally  Anesthesiologist: Murvin Natal, MD  Additional Notes: Functioning IV was confirmed and monitors were applied.  A timeout was performed. Sterile prep, hand hygiene and sterile gloves were used. A 27mm 21ga Arrow echogenic stimulator needle was used. Negative aspiration and negative test dose prior to incremental administration of local anesthetic. The patient tolerated the procedure well.  Ultrasound guidance: relevent anatomy identified, needle position confirmed, local anesthetic spread visualized around nerve(s), vascular puncture avoided.  Image printed for medical record.

## 2018-11-07 ENCOUNTER — Encounter (HOSPITAL_BASED_OUTPATIENT_CLINIC_OR_DEPARTMENT_OTHER): Payer: Self-pay | Admitting: Orthopedic Surgery

## 2018-11-16 DEATH — deceased

## 2018-12-15 ENCOUNTER — Encounter (HOSPITAL_BASED_OUTPATIENT_CLINIC_OR_DEPARTMENT_OTHER): Payer: Medicare Other

## 2019-01-23 ENCOUNTER — Ambulatory Visit: Payer: Medicare Other | Admitting: Pulmonary Disease

## 2019-10-20 ENCOUNTER — Other Ambulatory Visit: Payer: Self-pay | Admitting: Pulmonary Disease

## 2019-10-20 DIAGNOSIS — G4733 Obstructive sleep apnea (adult) (pediatric): Secondary | ICD-10-CM
